# Patient Record
Sex: Male | Born: 1945 | Race: White | Hispanic: No | Marital: Married | Smoking: Never smoker
Health system: Southern US, Community
[De-identification: ages and names within clinical notes are randomized; demographics above are authoritative.]

## PROBLEM LIST (undated history)

## (undated) DIAGNOSIS — M5136 Other intervertebral disc degeneration, lumbar region: Secondary | ICD-10-CM

## (undated) DIAGNOSIS — E785 Hyperlipidemia, unspecified: Secondary | ICD-10-CM

## (undated) DIAGNOSIS — R739 Hyperglycemia, unspecified: Secondary | ICD-10-CM

## (undated) DIAGNOSIS — I10 Essential (primary) hypertension: Secondary | ICD-10-CM

## (undated) DIAGNOSIS — M51369 Other intervertebral disc degeneration, lumbar region without mention of lumbar back pain or lower extremity pain: Secondary | ICD-10-CM

## (undated) DIAGNOSIS — Z974 Presence of external hearing-aid: Secondary | ICD-10-CM

## (undated) HISTORY — PX: KNEE ARTHROSCOPY: SUR90

---
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
## 1993-09-14 HISTORY — PX: CARPAL TUNNEL RELEASE: SHX101

## 2000-09-14 HISTORY — PX: KNEE ARTHROSCOPY: SUR90

## 2012-07-25 ENCOUNTER — Ambulatory Visit: Payer: Self-pay | Admitting: Orthopedic Surgery

## 2012-08-01 ENCOUNTER — Ambulatory Visit: Payer: Self-pay | Admitting: Pain Medicine

## 2012-08-08 ENCOUNTER — Ambulatory Visit: Payer: Self-pay

## 2012-08-08 IMAGING — CT CT PELVIS W/O CM
1 of 2 series · 13 of 32 positions shown, 19 images · non-contrast
Comparison: none

REASON FOR EXAM: Dr RAYMONDE [REDACTED] [address]
RAYMONDE [HOSPITAL] [TI]...
COMMENTS:

PROCEDURE:     KCT - KCT PELVIS STANDARD WO  - [DATE]  [DATE]
RESULT:
TECHNIQUE: Multislice helical acquisition through the pelvis is
reconstructed at 3 mm slice thickness at bone window settings in the axial,
coronal and sagittal planes. There is no previous similar study for
comparison. Reconstructions through the left hip at bone window settings
were also performed and are dictated separately.

[Series 2: pelvisbone 3.0 b70f · axial · 0.93mm/px · z∈[-1318,-1084]mm · 13 of 92 slices shown, 19 images]
[im 7/92  soft-tissue]
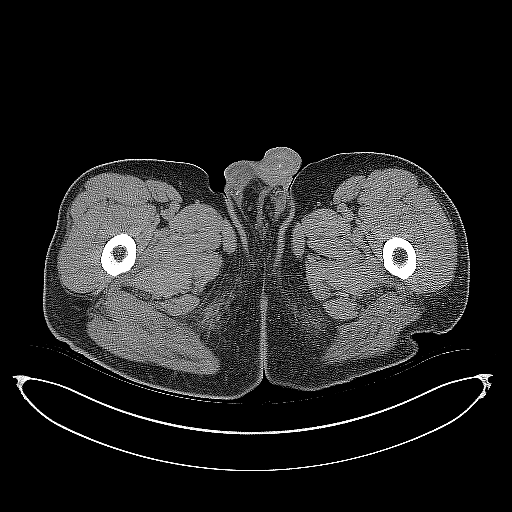
[im 7/92  bone]
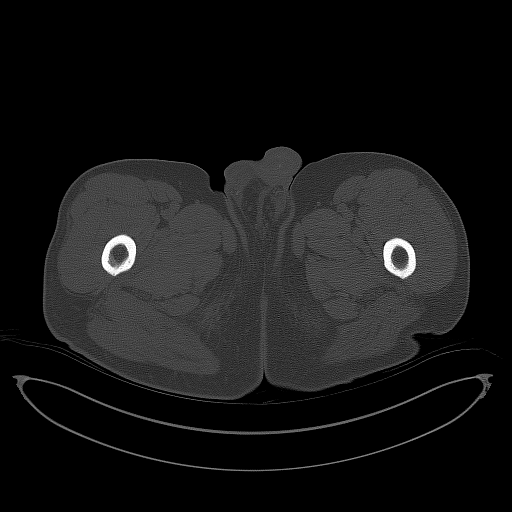
[im 13/92  soft-tissue]
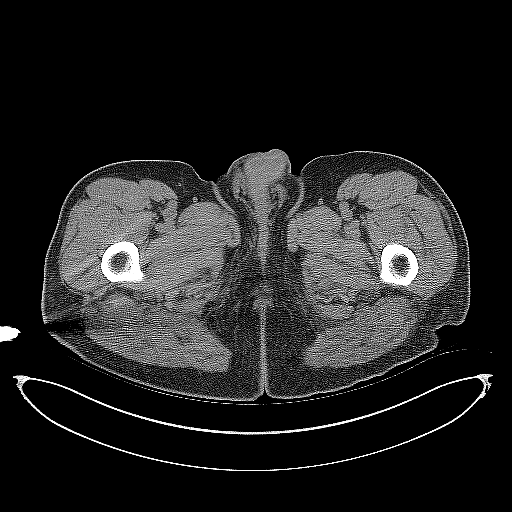
[im 19/92  soft-tissue]
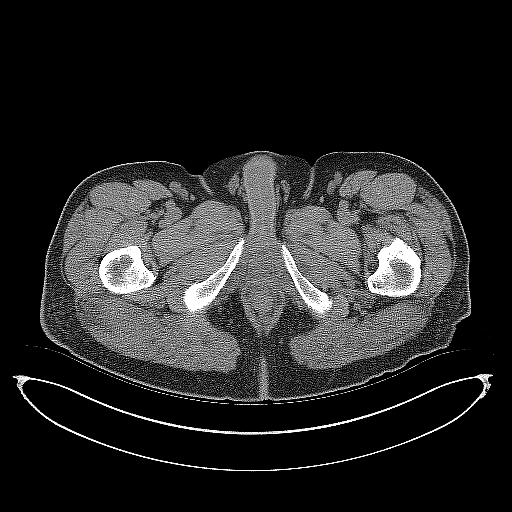
[im 25/92  soft-tissue]
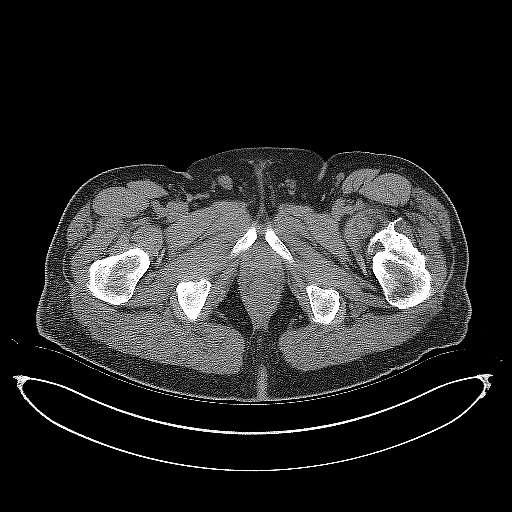
[im 31/92  soft-tissue]
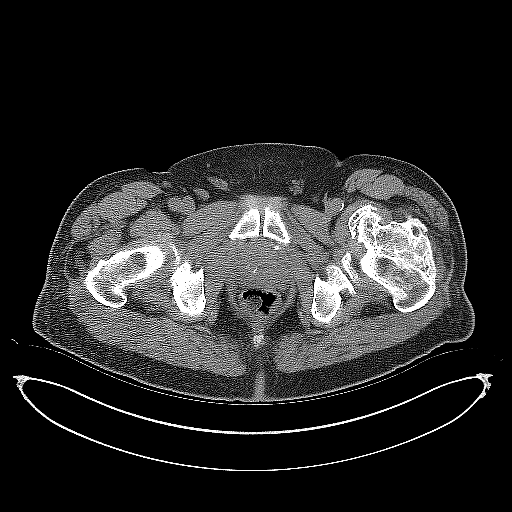
[im 37/92  soft-tissue]
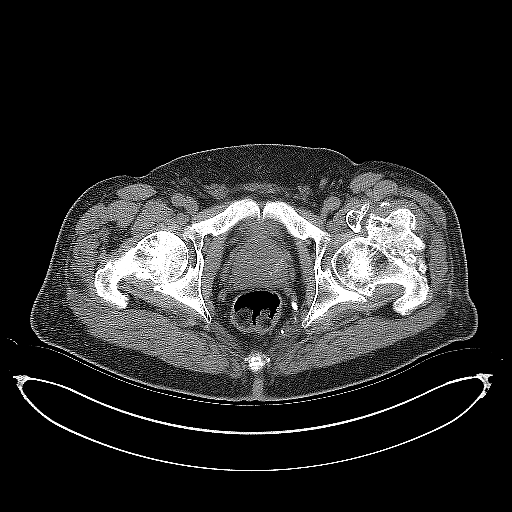
[im 49/92  soft-tissue]
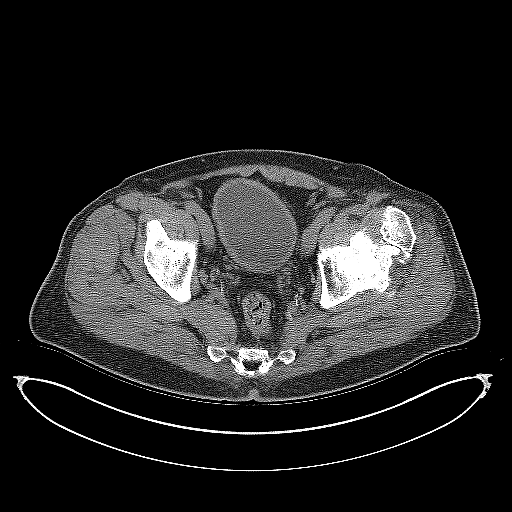
[im 55/92  soft-tissue]
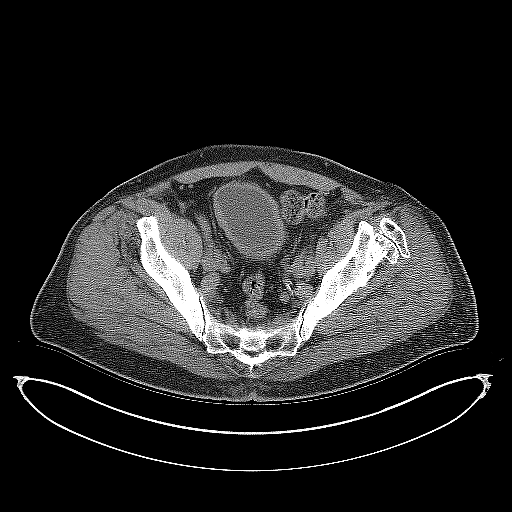
[im 61/92  soft-tissue]
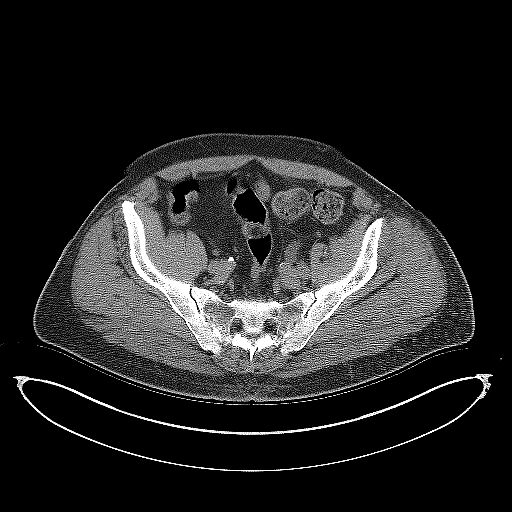
[im 61/92  bone]
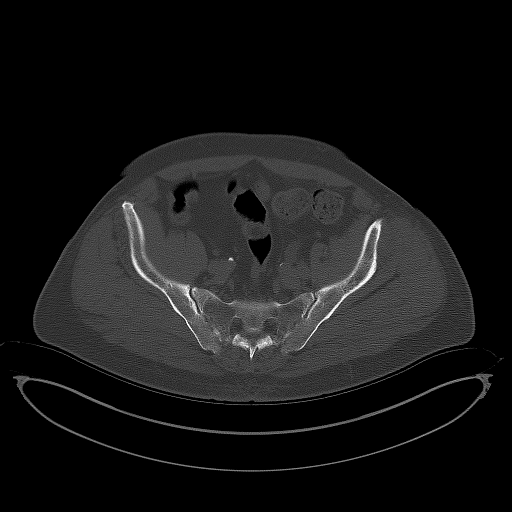
[im 67/92  soft-tissue]
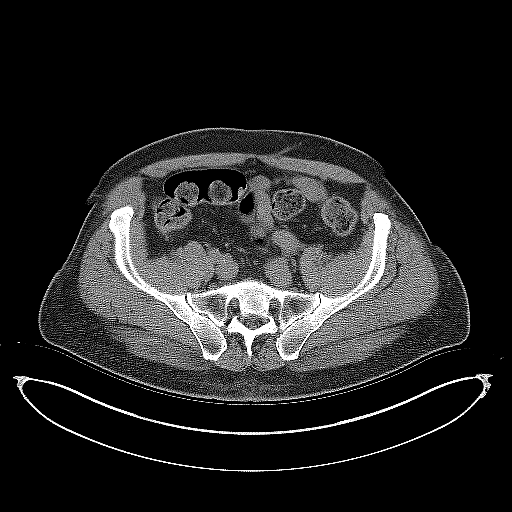
[im 67/92  lung]
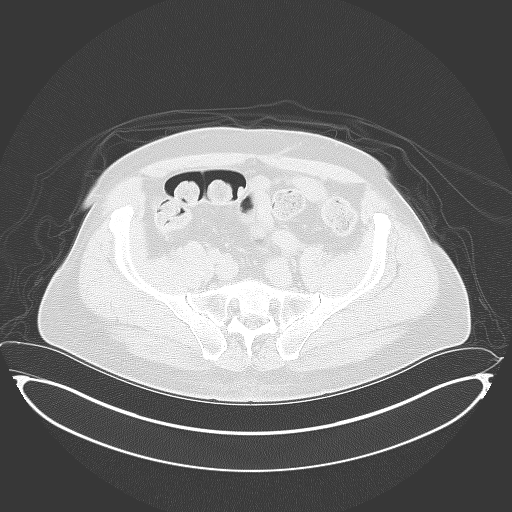
[im 73/92  soft-tissue]
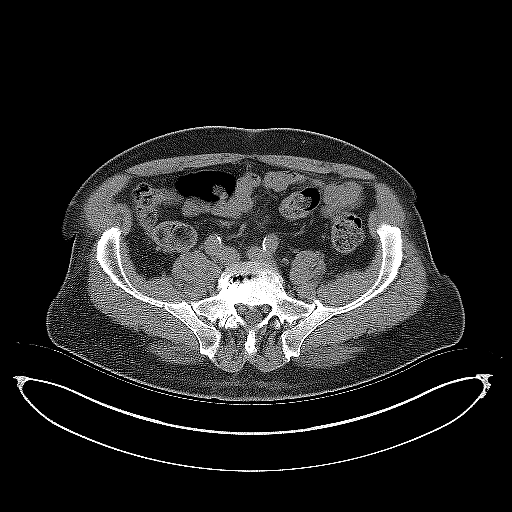
[im 73/92  lung]
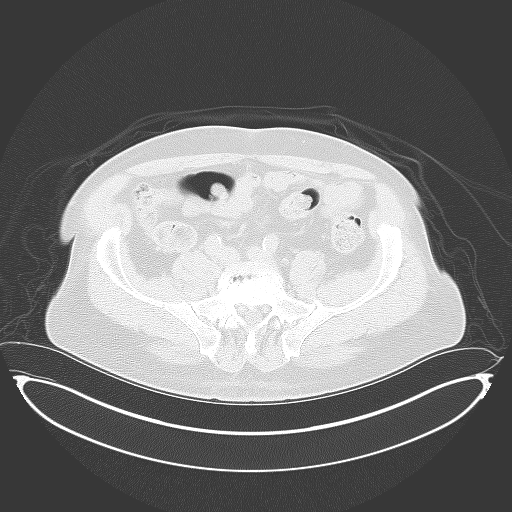
[im 79/92  soft-tissue]
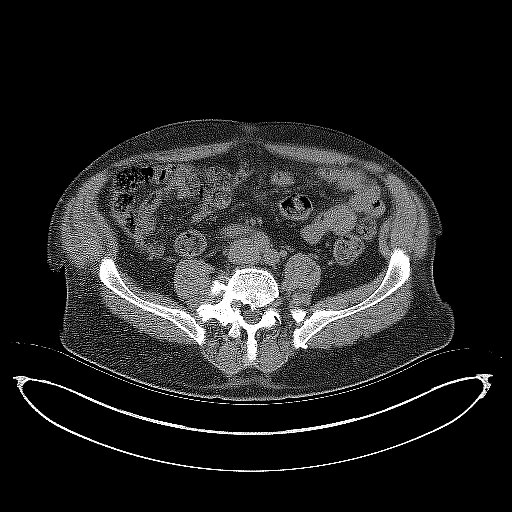
[im 79/92  lung]
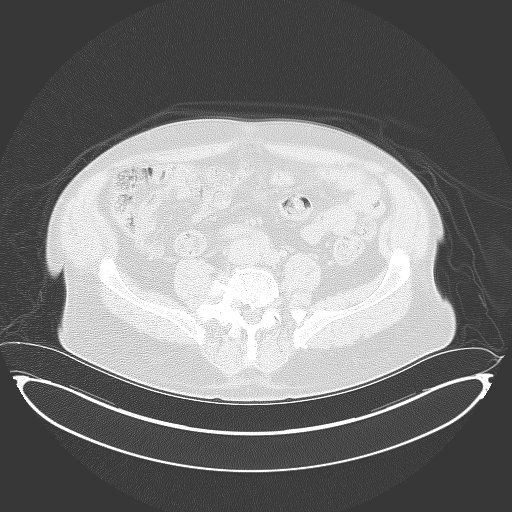
[im 85/92  soft-tissue]
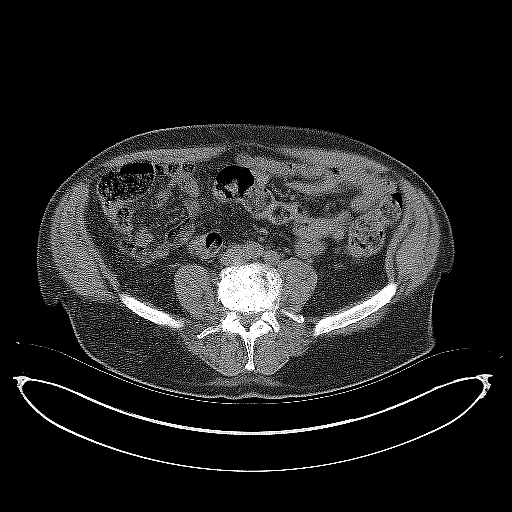
[im 85/92  lung]
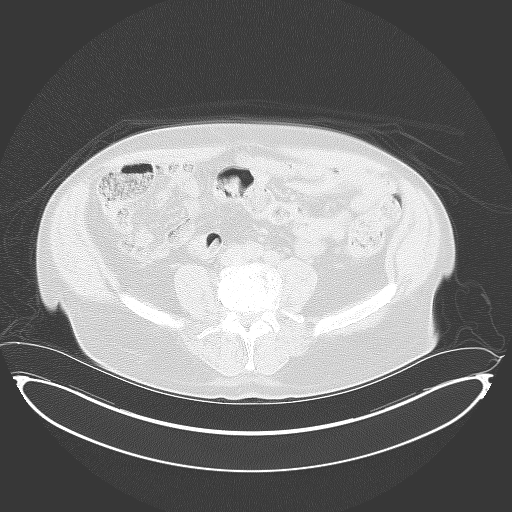

[13 of 32 positions shown; findings below may reference images not displayed]

FINDINGS: There is advanced heterotopic ossification predominantly anterior
to the proximal left femur and hip but also superior to the greater
trochanter and lateral to the acetabulum. Atherosclerotic calcification is
present. Multiple phleboliths are seen in the pelvic region. There is no
evidence of an acute fracture. Degenerative changes with hypertrophic
spurring are seen in the lumbosacral region and L4-5 level. L4-5
degenerative disc narrowing and L5-S1 degenerative disc narrowing with
prominent hypertrophic spurring are seen. Facet hypertrophy is present. The
bowel gas pattern appears unremarkable. The urinary bladder appears within
normal limits.
IMPRESSION: 1.  Advanced heterotopic ossification about the left hip.
2.  Degenerative changes in the lower lumbar spine.
3.  Atherosclerotic calcification.

[REDACTED]

## 2012-09-14 HISTORY — PX: JOINT REPLACEMENT: SHX530

## 2014-08-14 ENCOUNTER — Ambulatory Visit: Payer: Self-pay | Admitting: Otolaryngology

## 2014-09-11 ENCOUNTER — Encounter: Payer: Self-pay | Admitting: Otolaryngology

## 2014-09-14 ENCOUNTER — Encounter: Payer: Self-pay | Admitting: Otolaryngology

## 2015-05-29 ENCOUNTER — Other Ambulatory Visit: Payer: Self-pay | Admitting: Orthopedic Surgery

## 2015-05-29 DIAGNOSIS — M4726 Other spondylosis with radiculopathy, lumbar region: Secondary | ICD-10-CM

## 2015-05-29 DIAGNOSIS — M5126 Other intervertebral disc displacement, lumbar region: Secondary | ICD-10-CM

## 2015-05-29 DIAGNOSIS — M5136 Other intervertebral disc degeneration, lumbar region: Secondary | ICD-10-CM

## 2015-06-11 ENCOUNTER — Ambulatory Visit: Payer: Self-pay

## 2015-06-18 ENCOUNTER — Ambulatory Visit
Admission: RE | Admit: 2015-06-18 | Discharge: 2015-06-18 | Disposition: A | Discharge status: Home / Self Care | Payer: Medicare Other | Source: Ambulatory Visit | Attending: Orthopedic Surgery | Admitting: Orthopedic Surgery

## 2015-06-18 DIAGNOSIS — M4806 Spinal stenosis, lumbar region: Secondary | ICD-10-CM | POA: Diagnosis not present

## 2015-06-18 DIAGNOSIS — M419 Scoliosis, unspecified: Secondary | ICD-10-CM | POA: Diagnosis not present

## 2015-06-18 DIAGNOSIS — M5136 Other intervertebral disc degeneration, lumbar region: Secondary | ICD-10-CM | POA: Insufficient documentation

## 2015-06-18 DIAGNOSIS — M4726 Other spondylosis with radiculopathy, lumbar region: Secondary | ICD-10-CM | POA: Diagnosis present

## 2015-06-18 DIAGNOSIS — M5126 Other intervertebral disc displacement, lumbar region: Secondary | ICD-10-CM | POA: Diagnosis present

## 2016-07-01 ENCOUNTER — Encounter: Payer: Self-pay | Admitting: *Deleted

## 2016-07-02 ENCOUNTER — Encounter: Payer: Self-pay | Admitting: Anesthesiology

## 2016-07-02 ENCOUNTER — Ambulatory Visit: Payer: Medicare Other | Admitting: Anesthesiology

## 2016-07-02 ENCOUNTER — Encounter
Admission: RE | Disposition: A | Discharge status: Home / Self Care | Payer: Self-pay | Source: Ambulatory Visit | Attending: Gastroenterology

## 2016-07-02 ENCOUNTER — Ambulatory Visit
Admission: RE | Admit: 2016-07-02 | Discharge: 2016-07-02 | Disposition: A | Discharge status: Home / Self Care | Payer: Medicare Other | Source: Ambulatory Visit | Attending: Gastroenterology | Admitting: Gastroenterology

## 2016-07-02 DIAGNOSIS — Z7982 Long term (current) use of aspirin: Secondary | ICD-10-CM | POA: Diagnosis not present

## 2016-07-02 DIAGNOSIS — I10 Essential (primary) hypertension: Secondary | ICD-10-CM | POA: Diagnosis not present

## 2016-07-02 DIAGNOSIS — Z882 Allergy status to sulfonamides status: Secondary | ICD-10-CM | POA: Insufficient documentation

## 2016-07-02 DIAGNOSIS — K621 Rectal polyp: Secondary | ICD-10-CM | POA: Insufficient documentation

## 2016-07-02 DIAGNOSIS — E785 Hyperlipidemia, unspecified: Secondary | ICD-10-CM | POA: Insufficient documentation

## 2016-07-02 DIAGNOSIS — Z1211 Encounter for screening for malignant neoplasm of colon: Secondary | ICD-10-CM | POA: Diagnosis not present

## 2016-07-02 DIAGNOSIS — M5136 Other intervertebral disc degeneration, lumbar region: Secondary | ICD-10-CM | POA: Insufficient documentation

## 2016-07-02 DIAGNOSIS — Z880 Allergy status to penicillin: Secondary | ICD-10-CM | POA: Diagnosis not present

## 2016-07-02 DIAGNOSIS — Z791 Long term (current) use of non-steroidal anti-inflammatories (NSAID): Secondary | ICD-10-CM | POA: Diagnosis not present

## 2016-07-02 DIAGNOSIS — K635 Polyp of colon: Secondary | ICD-10-CM | POA: Insufficient documentation

## 2016-07-02 DIAGNOSIS — Z86010 Personal history of colonic polyps: Secondary | ICD-10-CM | POA: Diagnosis present

## 2016-07-02 DIAGNOSIS — Z79899 Other long term (current) drug therapy: Secondary | ICD-10-CM | POA: Diagnosis not present

## 2016-07-02 DIAGNOSIS — Z883 Allergy status to other anti-infective agents status: Secondary | ICD-10-CM | POA: Insufficient documentation

## 2016-07-02 HISTORY — PX: COLONOSCOPY WITH PROPOFOL: SHX5780

## 2016-07-02 HISTORY — DX: Other intervertebral disc degeneration, lumbar region: M51.36

## 2016-07-02 HISTORY — DX: Hyperglycemia, unspecified: R73.9

## 2016-07-02 HISTORY — DX: Essential (primary) hypertension: I10

## 2016-07-02 HISTORY — DX: Hyperlipidemia, unspecified: E78.5

## 2016-07-02 HISTORY — DX: Other intervertebral disc degeneration, lumbar region without mention of lumbar back pain or lower extremity pain: M51.369

## 2016-07-02 SURGERY — COLONOSCOPY WITH PROPOFOL
Anesthesia: General

## 2016-07-02 MED ORDER — PROPOFOL 10 MG/ML IV BOLUS
INTRAVENOUS | Status: DC | PRN
  Administered 2016-07-02: 30 mg via INTRAVENOUS
  Administered 2016-07-02: 20 mg via INTRAVENOUS
  Administered 2016-07-02: 50 mg via INTRAVENOUS

## 2016-07-02 MED ORDER — SODIUM CHLORIDE 0.9 % IV SOLN: INTRAVENOUS | Status: DC

## 2016-07-02 MED ORDER — SODIUM CHLORIDE 0.9 % IV SOLN
INTRAVENOUS | Status: DC
  Administered 2016-07-02: 08:00:00 via INTRAVENOUS

## 2016-07-02 MED ORDER — MIDAZOLAM HCL 5 MG/5ML IJ SOLN
INTRAMUSCULAR | Status: DC | PRN
  Administered 2016-07-02: 1 mg via INTRAVENOUS

## 2016-07-02 MED ORDER — PROPOFOL 500 MG/50ML IV EMUL
INTRAVENOUS | Status: DC | PRN
  Administered 2016-07-02: 120 ug/kg/min via INTRAVENOUS

## 2016-07-02 MED ORDER — FENTANYL CITRATE (PF) 100 MCG/2ML IJ SOLN
INTRAMUSCULAR | Status: DC | PRN
  Administered 2016-07-02: 50 ug via INTRAVENOUS

## 2016-07-02 MED ORDER — LIDOCAINE 2% (20 MG/ML) 5 ML SYRINGE
INTRAMUSCULAR | Status: DC | PRN
  Administered 2016-07-02: 50 mg via INTRAVENOUS

## 2016-07-02 NOTE — H&P (Signed)
Outpatient short stay form Pre-procedure 07/02/2016 8:22 AM Russell DeemMartin Wu Russell [name] MD  Primary Physician: Dr. Aram BeechamJeffrey Wu  Reason for visit:  Colonoscopy  History of present illness:  Patient is a 70 year old male presenting today for colonoscopy. He has personal history of colon polyps. He tolerated his prep well. He does not take any regular aspirin or blood thinning agents. He did have a total hip arthroplasty in January 2014, over 3 years ago.    Current Facility-Administered Medications:  .  0.9 %  sodium chloride infusion, , Intravenous, Continuous, Russell DeemMartin Wu Russell Weinfeld, MD  Prescriptions Prior to Admission  Medication Sig Dispense Refill Last Dose  . acetaminophen (TYLENOL) 500 MG tablet Take 500 mg by mouth every 4 (four) hours as needed.   07/02/2016 at 0600  . amLODipine (NORVASC) 10 MG tablet Take 10 mg by mouth daily.   Past Week at 2100  . aspirin 325 MG tablet Take 325 mg by mouth daily.   Past Week at Unknown time  . atorvastatin (LIPITOR) 20 MG tablet Take 20 mg by mouth daily.   Past Week at Unknown time  . doxazosin (CARDURA) 2 MG tablet Take 2 mg by mouth daily.   Past Week at Unknown time  . hydrochlorothiazide (HYDRODIURIL) 25 MG tablet Take 25 mg by mouth daily.   Past Week at Unknown time  . losartan (COZAAR) 100 MG tablet Take 100 mg by mouth daily.   Past Week at Unknown time  . magnesium oxide (MAG-OX) 400 MG tablet Take 400 mg by mouth daily.   Past Week at Unknown time  . naproxen sodium (ANAPROX) 220 MG tablet Take 220 mg by mouth 2 (two) times daily with a meal.   Past Week at Unknown time  . potassium chloride SA (K-DUR,KLOR-CON) 20 MEQ tablet Take 20 mEq by mouth 2 (two) times daily.   Past Week at Unknown time  . clindamycin (CLEOCIN) 150 MG capsule Take by mouth daily.   Completed Course at Unknown time     Allergies  Allergen Reactions  . Lincocin [Lincomycin Hcl]   . Penicillins   . Sulfa Antibiotics      Past Medical History:  Diagnosis Date  .  Degenerative disc disease, lumbar   . Hyperglycemia   . Hyperlipidemia   . Hypertension     Review of systems:      Physical Exam    Heart and lungs: Regular rate and rhythm without rub or gallop, lungs are bilaterally clear.    HEENT: Normocephalic atraumatic eyes are anicteric    Other:     Pertinant exam for procedure: Soft nontender nondistended bowel sounds positive normoactive.    Planned proceedures: Colonoscopy and indicated procedures. I have discussed the risks benefits and complications of procedures to include not limited to bleeding, infection, perforation and the risk of sedation and the patient wishes to proceed.    Russell DeemMartin Wu Russell Nazir, MD Gastroenterology 07/02/2016  8:22 AM

## 2016-07-02 NOTE — Anesthesia Preprocedure Evaluation (Signed)
Anesthesia Evaluation  Patient identified by MRN, date of birth, ID band Patient awake    Reviewed: Allergy & Precautions, H&P , NPO status , Patient's Chart, lab work & pertinent test results  Airway Mallampati: I  TM Distance: >3 FB Neck ROM: limited    Dental  (+) Poor Dentition, Chipped   Pulmonary COPD,    Pulmonary exam normal breath sounds clear to auscultation       Cardiovascular Exercise Tolerance: Good hypertension, (-) angina(-) Past MI and (-) DOE Normal cardiovascular exam Rhythm:regular Rate:Normal     Neuro/Psych negative neurological ROS  negative psych ROS   GI/Hepatic negative GI ROS, Neg liver ROS, neg GERD  ,  Endo/Other  negative endocrine ROS  Renal/GU negative Renal ROS  negative genitourinary   Musculoskeletal  (+) Arthritis ,   Abdominal   Peds  Hematology negative hematology ROS (+)   Anesthesia Other Findings Past Medical History: No date: Degenerative disc disease, lumbar No date: Hyperglycemia No date: Hyperlipidemia No date: Hypertension  History reviewed. No pertinent surgical history.     Reproductive/Obstetrics negative OB ROS                             Anesthesia Physical Anesthesia Plan  ASA: III  Anesthesia Plan: General   Post-op Pain Management:    Induction:   Airway Management Planned:   Additional Equipment:   Intra-op Plan:   Post-operative Plan:   Informed Consent: I have reviewed the patients History and Physical, chart, labs and discussed the procedure including the risks, benefits and alternatives for the proposed anesthesia with the patient or authorized representative who has indicated his/her understanding and acceptance.   Dental Advisory Given  Plan Discussed with: Anesthesiologist, CRNA and Surgeon  Anesthesia Plan Comments:         Anesthesia Quick Evaluation

## 2016-07-02 NOTE — Anesthesia Postprocedure Evaluation (Signed)
Anesthesia Post Note  Patient: Russell Wu  Procedure(s) Performed: Procedure(s) (LRB): COLONOSCOPY WITH PROPOFOL (N/A)  Patient location during evaluation: Endoscopy Anesthesia Type: General Level of consciousness: awake and alert Pain management: pain level controlled Vital Signs Assessment: post-procedure vital signs reviewed and stable Respiratory status: spontaneous breathing, nonlabored ventilation, respiratory function stable and patient connected to nasal cannula oxygen Cardiovascular status: blood pressure returned to baseline and stable Postop Assessment: no signs of nausea or vomiting Anesthetic complications: no    Last Vitals:  Vitals:   07/02/16 1040 07/02/16 1050  BP: 131/82 115/85  Pulse: 84 83  Resp: 18 (!) 21  Temp:      Last Pain:  Vitals:   07/02/16 1020  TempSrc: Tympanic  PainSc: Asleep                 Cleda MccreedyJoseph K Jatziri Goffredo

## 2016-07-02 NOTE — Transfer of Care (Signed)
Immediate Anesthesia Transfer of Care Note  Patient: Russell Wu  Procedure(s) Performed: Procedure(s): COLONOSCOPY WITH PROPOFOL (N/A)  Patient Location: Endoscopy Unit  Anesthesia Type:General  Level of Consciousness: sedated  Airway & Oxygen Therapy: Patient Spontanous Breathing and Patient connected to nasal cannula oxygen  Post-op Assessment: Report given to RN and Post -op Vital signs reviewed and stable  Post vital signs: Reviewed  Last Vitals:  Vitals:   07/02/16 0809 07/02/16 1020  BP: (!) 148/86 107/70  Pulse: 83 70  Resp: 16 18  Temp: (!) 35.8 C 36.3 C    Last Pain:  Vitals:   07/02/16 0809  TempSrc: Tympanic         Complications: No apparent anesthesia complications

## 2016-07-02 NOTE — Op Note (Addendum)
[facility] Gastroenterology Patient Name: Russell SchwartzLloyd Morella Procedure Date: 07/02/2016 9:37 AM MRN: [id number] Account #: [id number] Date of Birth: [birth year] Admit Type: Outpatient Age: [age] [facility] END[facility] ROOM 3 Gender: Male Note Status: Finalized Procedure:            Colonoscopy Indications:          Personal history of colonic polyps Providers:            Christena DeemMartin U. Rande Dario, MD Referring MD:         Duane LopeJeffrey D. Judithann SheenSparks, MD (Referring MD) Medicines:            Monitored Anesthesia Care Complications:        No immediate complications. Procedure:            Pre-Anesthesia Assessment:                       - ASA Grade Assessment: III - A patient with severe                        systemic disease.                       After obtaining informed consent, the colonoscope was                        passed under direct vision. Throughout the procedure,                        the patient's blood pressure, pulse, and oxygen                        saturations were monitored continuously. The                        Colonoscope was introduced through the anus and                        advanced to the the cecum, identified by appendiceal                        orifice and ileocecal valve. The colonoscopy was                        performed with moderate difficulty due to significant                        looping and a tortuous colon. Successful completion of                        the procedure was aided by using manual pressure. The                        patient tolerated the procedure well. The quality of                        the bowel preparation was good. Findings:      A 1 mm polyp was found in the hepatic flexure. The polyp was sessile.       The polyp was removed with a cold biopsy forceps. Resection and       retrieval were  complete.      Two sessile polyps were found in the rectum. The polyps were less than 1       mm in size. These polyps were removed with a  cold biopsy forceps.       Resection and retrieval were complete.      The retroflexed view of the distal rectum and anal verge was normal and       showed no anal or rectal abnormalities.      The exam was otherwise normal throughout the examined colon.      The digital rectal exam was normal. Impression:           - One 1 mm polyp at the hepatic flexure, removed with a                        cold biopsy forceps. Resected and retrieved.                       - Two less than 1 mm polyps in the rectum, removed with                        a cold biopsy forceps. Resected and retrieved.                       - The distal rectum and anal verge are normal on                        retroflexion view. Recommendation:       - Discharge patient to home.                       - Await pathology results.                       - Telephone GI clinic for pathology results in 1 week. Procedure Code(s):    --- Professional ---                       [phone], Colonoscopy, flexible; with biopsy, single or                        multiple Diagnosis Code(s):    --- Professional ---                       D12.3, Benign neoplasm of transverse colon (hepatic                        flexure or splenic flexure)                       K62.1, Rectal polyp                       Z86.010, Personal history of colonic polyps CPT copyright 2016 American Medical Association. All rights reserved. The codes documented in this report are preliminary and upon coder review may  be revised to meet current compliance requirements. Christena Deem, MD 07/02/2016 10:18:50 AM This report has been signed electronically. Number of Addenda: 0 Note Initiated On: 07/02/2016 9:37 AM Scope Withdrawal Time: 0 hours 5 minutes 16 seconds  Total Procedure Duration: 0 hours 26 minutes 52 seconds  [facility]

## 2016-07-03 LAB — SURGICAL PATHOLOGY

## 2016-07-04 ENCOUNTER — Encounter: Payer: Self-pay | Admitting: Gastroenterology

## 2019-02-24 ENCOUNTER — Other Ambulatory Visit: Payer: Self-pay

## 2019-02-24 ENCOUNTER — Other Ambulatory Visit
Admission: RE | Admit: 2019-02-24 | Discharge: 2019-02-24 | Disposition: A | Discharge status: Home / Self Care | Payer: Medicare Other | Source: Ambulatory Visit | Attending: Internal Medicine | Admitting: Internal Medicine

## 2019-02-24 DIAGNOSIS — Z1159 Encounter for screening for other viral diseases: Secondary | ICD-10-CM | POA: Diagnosis not present

## 2019-02-24 DIAGNOSIS — Z01812 Encounter for preprocedural laboratory examination: Secondary | ICD-10-CM | POA: Insufficient documentation

## 2019-02-25 LAB — NOVEL CORONAVIRUS, NAA (HOSP ORDER, SEND-OUT TO REF LAB; TAT 18-24 HRS): SARS-CoV-2, NAA: NOT DETECTED

## 2019-03-01 ENCOUNTER — Other Ambulatory Visit: Payer: Self-pay

## 2019-03-01 ENCOUNTER — Ambulatory Visit
Admission: RE | Admit: 2019-03-01 | Discharge: 2019-03-01 | Disposition: A | Discharge status: Home / Self Care | Payer: Medicare Other | Attending: Internal Medicine | Admitting: Internal Medicine

## 2019-03-01 ENCOUNTER — Encounter
Admission: RE | Disposition: A | Discharge status: Home / Self Care | Payer: Self-pay | Source: Home / Self Care | Attending: Internal Medicine

## 2019-03-01 ENCOUNTER — Ambulatory Visit: Payer: Medicare Other | Admitting: Anesthesiology

## 2019-03-01 ENCOUNTER — Encounter: Payer: Self-pay | Admitting: *Deleted

## 2019-03-01 DIAGNOSIS — D509 Iron deficiency anemia, unspecified: Secondary | ICD-10-CM | POA: Insufficient documentation

## 2019-03-01 DIAGNOSIS — E785 Hyperlipidemia, unspecified: Secondary | ICD-10-CM | POA: Diagnosis not present

## 2019-03-01 DIAGNOSIS — Z882 Allergy status to sulfonamides status: Secondary | ICD-10-CM | POA: Diagnosis not present

## 2019-03-01 DIAGNOSIS — Z791 Long term (current) use of non-steroidal anti-inflammatories (NSAID): Secondary | ICD-10-CM | POA: Insufficient documentation

## 2019-03-01 DIAGNOSIS — Z7982 Long term (current) use of aspirin: Secondary | ICD-10-CM | POA: Diagnosis not present

## 2019-03-01 DIAGNOSIS — Z880 Allergy status to penicillin: Secondary | ICD-10-CM | POA: Insufficient documentation

## 2019-03-01 DIAGNOSIS — Z79899 Other long term (current) drug therapy: Secondary | ICD-10-CM | POA: Diagnosis not present

## 2019-03-01 DIAGNOSIS — K640 First degree hemorrhoids: Secondary | ICD-10-CM | POA: Diagnosis not present

## 2019-03-01 DIAGNOSIS — I10 Essential (primary) hypertension: Secondary | ICD-10-CM | POA: Diagnosis not present

## 2019-03-01 DIAGNOSIS — R739 Hyperglycemia, unspecified: Secondary | ICD-10-CM | POA: Diagnosis not present

## 2019-03-01 HISTORY — PX: COLONOSCOPY WITH PROPOFOL: SHX5780

## 2019-03-01 HISTORY — PX: ESOPHAGOGASTRODUODENOSCOPY (EGD) WITH PROPOFOL: SHX5813

## 2019-03-01 SURGERY — ESOPHAGOGASTRODUODENOSCOPY (EGD) WITH PROPOFOL
Anesthesia: General

## 2019-03-01 MED ORDER — LIDOCAINE HCL (PF) 2 % IJ SOLN
INTRAMUSCULAR | Status: AC
  Filled 2019-03-01: qty 10

## 2019-03-01 MED ORDER — PROPOFOL 10 MG/ML IV BOLUS
INTRAVENOUS | Status: AC
  Filled 2019-03-01: qty 20

## 2019-03-01 MED ORDER — PROPOFOL 10 MG/ML IV BOLUS
INTRAVENOUS | Status: DC | PRN
  Administered 2019-03-01: 80 mg via INTRAVENOUS

## 2019-03-01 MED ORDER — PROPOFOL 500 MG/50ML IV EMUL
INTRAVENOUS | Status: DC | PRN
  Administered 2019-03-01: 160 ug/kg/min via INTRAVENOUS

## 2019-03-01 MED ORDER — PROPOFOL 500 MG/50ML IV EMUL
INTRAVENOUS | Status: AC
  Filled 2019-03-01: qty 50

## 2019-03-01 MED ORDER — SODIUM CHLORIDE 0.9 % IV SOLN
INTRAVENOUS | Status: DC
  Administered 2019-03-01: 09:00:00 via INTRAVENOUS

## 2019-03-01 NOTE — H&P (Signed)
Outpatient short stay form Pre-procedure 03/01/2019 8:16 AM Russell Wu K. Russell Wu, M.D.  Primary Physician: Fulton Reek, M.D.  Reason for visit: Iron deficiency anemia.   History of present illness:  Patient presents with iron deficiency anemia. He uses NSAIDs. Patient denies change in bowel habits, rectal bleeding, weight loss or abdominal pain.  EGD and colonoscopy are planned for today. Last colonoscopy in 2017 showed only hyperplastic polyps and no other abnormalities.     Current Facility-Administered Medications:  .  0.9 %  sodium chloride infusion, , Intravenous, Continuous, Jameson Tormey, Benay Pike, MD  Medications Prior to Admission  Medication Sig Dispense Refill Last Dose  . acetaminophen (TYLENOL) 500 MG tablet Take 500 mg by mouth every 4 (four) hours as needed.   03/01/2019 at Unknown time  . amLODipine (NORVASC) 10 MG tablet Take 10 mg by mouth daily.   02/28/2019 at Unknown time  . doxazosin (CARDURA) 2 MG tablet Take 2 mg by mouth daily.   02/28/2019 at Unknown time  . ezetimibe (ZETIA) 10 MG tablet Take 10 mg by mouth daily.     . hydrochlorothiazide (HYDRODIURIL) 25 MG tablet Take 25 mg by mouth daily.   02/28/2019 at Unknown time  . losartan (COZAAR) 100 MG tablet Take 100 mg by mouth daily.   02/28/2019 at Unknown time  . naproxen sodium (ANAPROX) 220 MG tablet Take 220 mg by mouth 2 (two) times daily with a meal.   Past Week at Unknown time  . potassium chloride SA (K-DUR,KLOR-CON) 20 MEQ tablet Take 20 mEq by mouth 2 (two) times daily.   02/28/2019 at Unknown time  . aspirin 325 MG tablet Take 325 mg by mouth daily.   Not Taking at Unknown time  . atorvastatin (LIPITOR) 20 MG tablet Take 20 mg by mouth daily.   Not Taking at Unknown time  . clindamycin (CLEOCIN) 150 MG capsule Take by mouth daily.     . magnesium oxide (MAG-OX) 400 MG tablet Take 400 mg by mouth daily.        Allergies  Allergen Reactions  . Lincocin [Lincomycin Hcl]   . Penicillins   . Sulfa Antibiotics       Past Medical History:  Diagnosis Date  . Degenerative disc disease, lumbar   . Hyperglycemia   . Hyperlipidemia   . Hypertension     Review of systems:  Otherwise negative.    Physical Exam  Gen: Alert, oriented. Appears stated age.  HEENT: [place]/AT. PERRLA. Lungs: CTA, no wheezes. CV: RR nl S1, S2. Abd: soft, benign, no masses. BS+ Ext: No edema. Pulses 2+    Planned procedures: Proceed with EGD and colonoscopy. The patient understands the nature of the planned procedure, indications, risks, alternatives and potential complications including but not limited to bleeding, infection, perforation, damage to internal organs and possible oversedation/side effects from anesthesia. The patient agrees and gives consent to proceed.  Please refer to procedure notes for findings, recommendations and patient disposition/instructions.     Russell Wu K. Russell Wu, M.D. Gastroenterology 03/01/2019  8:16 AM

## 2019-03-01 NOTE — Anesthesia Procedure Notes (Signed)
Procedure Name: MAC Date/Time: 03/01/2019 8:24 AM Performed by: Gentry Fitz, CRNA Pre-anesthesia Checklist: Patient identified, Emergency Drugs available, Suction available and Patient being monitored Patient Re-evaluated:Patient Re-evaluated prior to induction Oxygen Delivery Method: Nasal cannula Preoxygenation: Pre-oxygenation with 100% oxygen Induction Type: IV induction

## 2019-03-01 NOTE — Anesthesia Preprocedure Evaluation (Signed)
Anesthesia Evaluation  Patient identified by MRN, date of birth, ID band Patient awake    Reviewed: Allergy & Precautions, H&P , NPO status , Patient's Chart, lab work & pertinent test results, reviewed documented beta blocker date and time   Airway Mallampati: II   Neck ROM: full    Dental  (+) Poor Dentition   Pulmonary neg pulmonary ROS,    Pulmonary exam normal        Cardiovascular hypertension, negative cardio ROS Normal cardiovascular exam Rhythm:regular Rate:Normal     Neuro/Psych negative neurological ROS  negative psych ROS   GI/Hepatic negative GI ROS, Neg liver ROS,   Endo/Other  negative endocrine ROS  Renal/GU negative Renal ROS  negative genitourinary   Musculoskeletal   Abdominal   Peds  Hematology negative hematology ROS (+)   Anesthesia Other Findings Past Medical History: No date: Degenerative disc disease, lumbar No date: Hyperglycemia No date: Hyperlipidemia No date: Hypertension Past Surgical History: 1995: CARPAL TUNNEL RELEASE; Bilateral 1950: COLON SURGERY     Comment:  rectal polyp removed when 73 years old 07/02/2016: COLONOSCOPY WITH PROPOFOL; N/A     Comment:  Procedure: COLONOSCOPY WITH PROPOFOL;  Surgeon: Lollie Sails, MD;  Location: [facility] ENDOSCOPY;  Service:               Endoscopy;  Laterality: N/A; 2014: JOINT REPLACEMENT; Left early 2000s: KNEE ARTHROSCOPY; Left 2002: KNEE ARTHROSCOPY; Right 1957: TONSILLECTOMY BMI    Body Mass Index: 28.51 kg/m     Reproductive/Obstetrics negative OB ROS                             Anesthesia Physical Anesthesia Plan  ASA: III  Anesthesia Plan: General   Post-op Pain Management:    Induction:   PONV Risk Score and Plan:   Airway Management Planned:   Additional Equipment:   Intra-op Plan:   Post-operative Plan:   Informed Consent: I have reviewed the patients History and  Physical, chart, labs and discussed the procedure including the risks, benefits and alternatives for the proposed anesthesia with the patient or authorized representative who has indicated his/her understanding and acceptance.     Dental Advisory Given  Plan Discussed with: CRNA  Anesthesia Plan Comments:         Anesthesia Quick Evaluation

## 2019-03-01 NOTE — Anesthesia Post-op Follow-up Note (Signed)
Anesthesia QCDR form completed.        

## 2019-03-01 NOTE — Op Note (Signed)
[facility] Gastroenterology Patient Name: Russell Wu Procedure Date: 03/01/2019 7:01 AM MRN: [id number] Account #: [id number] Date of Birth: 1945/11/17 Admit Type: Outpatient Age: 73 Room: [facility] ENDO ROOM 3 Gender: Male Note Status: Finalized Procedure:            Upper GI endoscopy Indications:          Iron deficiency anemia Providers:            Benay Pike. [place] MD, MD Medicines:            Propofol per Anesthesia Complications:        No immediate complications. Procedure:            Pre-Anesthesia Assessment:                       - The risks and benefits of the procedure and the                        sedation options and risks were discussed with the                        patient. All questions were answered and informed                        consent was obtained.                       - Patient identification and proposed procedure were                        verified prior to the procedure by the nurse. The                        procedure was verified in the procedure room.                       - ASA Grade Assessment: III - A patient with severe                        systemic disease.                       - After reviewing the risks and benefits, the patient                        was deemed in satisfactory condition to undergo the                        procedure.                       After obtaining informed consent, the endoscope was                        passed under direct vision. Throughout the procedure,                        the patient's blood pressure, pulse, and oxygen                        saturations were monitored continuously. The Endoscope  was introduced through the mouth, and advanced to the                        third part of duodenum. The upper GI endoscopy was                        accomplished without difficulty. The patient tolerated                        the procedure well. Findings:  The examined esophagus was normal.      The entire examined stomach was normal.      The examined duodenum was normal.      The examined duodenum was normal. Impression:           - Normal esophagus.                       - Normal stomach.                       - Normal examined duodenum.                       - Normal examined duodenum.                       - No specimens collected. Recommendation:       - Proceed with colonoscopy Procedure Code(s):    --- Professional ---                       [id number], Esophagogastroduodenoscopy, flexible, transoral;                        diagnostic, including collection of specimen(s) by                        brushing or washing, when performed (separate procedure) Diagnosis Code(s):    --- Professional ---                       D50.9, Iron deficiency anemia, unspecified CPT copyright 2019 American Medical Association. All rights reserved. The codes documented in this report are preliminary and upon coder review may  be revised to meet current compliance requirements. Stanton Kidneyeodoro K Toledo MD, MD 03/01/2019 8:28:53 AM This report has been signed electronically. Number of Addenda: 0 Note Initiated On: 03/01/2019 7:01 AM Estimated Blood Loss: Estimated blood loss: none.      [facility]

## 2019-03-01 NOTE — Interval H&P Note (Signed)
History and Physical Interval Note:  03/01/2019 8:18 AM  Russell Wu  has presented today for surgery, with the diagnosis of Iron Deficiency Anemia.  The various methods of treatment have been discussed with the patient and family. After consideration of risks, benefits and other options for treatment, the patient has consented to  Procedure(s): ESOPHAGOGASTRODUODENOSCOPY (EGD) WITH PROPOFOL (N/A) COLONOSCOPY WITH PROPOFOL (N/A) as a surgical intervention.  The patient's history has been reviewed, patient examined, no change in status, stable for surgery.  I have reviewed the patient's chart and labs.  Questions were answered to the patient's satisfaction.     [place], [place]

## 2019-03-01 NOTE — Op Note (Signed)
[facility] Patient Name: Russell Wu Procedure Date: 03/01/2019 6:59 AM MRN: [id number] Account #: [id number] Date of Birth: [birth year] Admit Type: Outpatient Age: 73 Room: [facility] ENDO ROOM 3 Gender: Male Note Status: Finalized Procedure:            Colonoscopy Indications:          Iron deficiency anemia Providers:            Benay Pike. Toledo MD, MD Medicines:            Propofol per Anesthesia Complications:        No immediate complications. Procedure:            Pre-Anesthesia Assessment:                       - The risks and benefits of the procedure and the                        sedation options and risks were discussed with the                        patient. All questions were answered and informed                        consent was obtained.                       - Patient identification and proposed procedure were                        verified prior to the procedure by the nurse. The                        procedure was verified in the procedure room.                       - ASA Grade Assessment: III - A patient with severe                        systemic disease.                       - After reviewing the risks and benefits, the patient                        was deemed in satisfactory condition to undergo the                        procedure.                       After obtaining informed consent, the colonoscope was                        passed under direct vision. Throughout the procedure,                        the patient's blood pressure, pulse, and oxygen                        saturations were monitored continuously. The  Colonoscope was introduced through the anus and                        advanced to the the cecum, identified by appendiceal                        orifice and ileocecal valve. The colonoscopy was                        somewhat difficult due to a redundant colon. Successful                  completion of the procedure was aided by applying                        abdominal pressure. The patient tolerated the procedure                        well. The quality of the bowel preparation was                        excellent. Findings:      The perianal and digital rectal examinations were normal. Pertinent       negatives include normal sphincter tone and no palpable rectal lesions.      The colon (entire examined portion) appeared normal.      Non-bleeding internal hemorrhoids were found during retroflexion. The       hemorrhoids were Grade I (internal hemorrhoids that do not prolapse).      The exam was otherwise without abnormality. Impression:           - The entire examined colon is normal.                       - Non-bleeding internal hemorrhoids.                       - The examination was otherwise normal.                       - No specimens collected. Recommendation:       - Patient has a contact number available for                        emergencies. The signs and symptoms of potential                        delayed complications were discussed with the patient.                        Return to normal activities tomorrow. Written discharge                        instructions were provided to the patient.                       - Resume previous diet.                       - Continue present medications.                       - To  visualize the small bowel, perform video capsule                        endoscopy at appointment to be scheduled.                       - No further colonoscopies due to age and no evidence                        of adenomas/polyps.                       - The findings and recommendations were discussed with                        the patient. Procedure Code(s):    --- Professional ---                       [phone], Colonoscopy, flexible; diagnostic, including                        collection of specimen(s) by brushing or  washing, when                        performed (separate procedure) Diagnosis Code(s):    --- Professional ---                       D50.9, Iron deficiency anemia, unspecified                       K64.0, First degree hemorrhoids CPT copyright 2019 American Medical Association. All rights reserved. The codes documented in this report are preliminary and upon coder review may  be revised to meet current compliance requirements. Stanton Kidneyeodoro K Toledo MD, MD 03/01/2019 8:48:20 AM This report has been signed electronically. Number of Addenda: 0 Note Initiated On: 03/01/2019 6:59 AM Scope Withdrawal Time: 0 hours 6 minutes 9 seconds  Total Procedure Duration: 0 hours 12 minutes 51 seconds  Estimated Blood Loss: Estimated blood loss: none.      [facility]

## 2019-03-01 NOTE — Transfer of Care (Signed)
Immediate Anesthesia Transfer of Care Note  Patient: Russell Wu  Procedure(s) Performed: ESOPHAGOGASTRODUODENOSCOPY (EGD) WITH PROPOFOL (N/A ) COLONOSCOPY WITH PROPOFOL (N/A )  Patient Location: PACU  Anesthesia Type:General  Level of Consciousness: drowsy  Airway & Oxygen Therapy: Patient Spontanous Breathing and Patient connected to nasal cannula oxygen  Post-op Assessment: Report given to RN and Post -op Vital signs reviewed and stable  Post vital signs: Reviewed and stable  Last Vitals:  Vitals Value Taken Time  BP 110/69 03/01/19 0847  Temp 36.4 C 03/01/19 0846  Pulse 68 03/01/19 0847  Resp 17 03/01/19 0847  SpO2 97 % 03/01/19 0847  Vitals shown include unvalidated device data.  Last Pain:  Vitals:   03/01/19 0846  TempSrc:   PainSc: Asleep         Complications: No apparent anesthesia complications

## 2019-03-02 NOTE — Anesthesia Postprocedure Evaluation (Signed)
Anesthesia Post Note  Patient: Russell Wu  Procedure(s) Performed: ESOPHAGOGASTRODUODENOSCOPY (EGD) WITH PROPOFOL (N/A ) COLONOSCOPY WITH PROPOFOL (N/A )  Patient location during evaluation: PACU Anesthesia Type: General Level of consciousness: awake and alert Pain management: pain level controlled Vital Signs Assessment: post-procedure vital signs reviewed and stable Respiratory status: spontaneous breathing, nonlabored ventilation, respiratory function stable and patient connected to nasal cannula oxygen Cardiovascular status: blood pressure returned to baseline and stable Postop Assessment: no apparent nausea or vomiting Anesthetic complications: no     Last Vitals:  Vitals:   03/01/19 0906 03/01/19 0915  BP: 127/79 140/82  Pulse: 78 78  Resp: (!) 22 13  Temp:    SpO2: 95% 96%    Last Pain:  Vitals:   03/02/19 0800  TempSrc:   PainSc: 0-No pain                 Molli Barrows

## 2019-03-23 ENCOUNTER — Other Ambulatory Visit: Payer: Self-pay | Admitting: Internal Medicine

## 2019-03-23 DIAGNOSIS — R634 Abnormal weight loss: Secondary | ICD-10-CM

## 2019-03-23 DIAGNOSIS — D508 Other iron deficiency anemias: Secondary | ICD-10-CM

## 2019-04-04 ENCOUNTER — Other Ambulatory Visit: Payer: Self-pay

## 2019-04-04 ENCOUNTER — Ambulatory Visit
Admission: RE | Admit: 2019-04-04 | Discharge: 2019-04-04 | Disposition: A | Discharge status: Home / Self Care | Payer: Medicare Other | Source: Ambulatory Visit | Attending: Internal Medicine | Admitting: Internal Medicine

## 2019-04-04 DIAGNOSIS — D508 Other iron deficiency anemias: Secondary | ICD-10-CM | POA: Diagnosis present

## 2019-04-04 DIAGNOSIS — R634 Abnormal weight loss: Secondary | ICD-10-CM | POA: Diagnosis present

## 2019-04-04 LAB — POCT I-STAT CREATININE: Creatinine, Ser: 0.6 mg/dL — ABNORMAL LOW (ref 0.61–1.24)

## 2019-04-04 MED ORDER — IOHEXOL 300 MG/ML  SOLN
100.0000 mL | Freq: Once | INTRAMUSCULAR | Status: AC | PRN
  Administered 2019-04-04: 11:00:00 100 mL via INTRAVENOUS

## 2020-03-28 ENCOUNTER — Other Ambulatory Visit: Payer: Self-pay | Admitting: Internal Medicine

## 2020-03-28 DIAGNOSIS — J3801 Paralysis of vocal cords and larynx, unilateral: Secondary | ICD-10-CM

## 2020-04-16 ENCOUNTER — Ambulatory Visit
Admission: RE | Admit: 2020-04-16 | Discharge: 2020-04-16 | Disposition: A | Discharge status: Home / Self Care | Payer: Medicare Other | Source: Ambulatory Visit | Attending: Internal Medicine | Admitting: Internal Medicine

## 2020-04-16 ENCOUNTER — Other Ambulatory Visit: Payer: Self-pay

## 2020-04-16 DIAGNOSIS — J3801 Paralysis of vocal cords and larynx, unilateral: Secondary | ICD-10-CM | POA: Diagnosis present

## 2020-04-16 MED ORDER — IOHEXOL 300 MG/ML  SOLN
75.0000 mL | Freq: Once | INTRAMUSCULAR | Status: AC | PRN
  Administered 2020-04-16: 75 mL via INTRAVENOUS

## 2020-04-16 MED ORDER — GADOBUTROL 1 MMOL/ML IV SOLN
8.0000 mL | Freq: Once | INTRAVENOUS | Status: AC | PRN
  Administered 2020-04-16: 8 mL via INTRAVENOUS

## 2020-07-12 ENCOUNTER — Other Ambulatory Visit: Payer: Self-pay | Admitting: Neurosurgery

## 2020-07-12 ENCOUNTER — Other Ambulatory Visit: Payer: Self-pay | Admitting: Internal Medicine

## 2020-07-12 DIAGNOSIS — G939 Disorder of brain, unspecified: Secondary | ICD-10-CM

## 2020-07-12 DIAGNOSIS — R1319 Other dysphagia: Secondary | ICD-10-CM

## 2020-07-25 ENCOUNTER — Ambulatory Visit
Admission: RE | Admit: 2020-07-25 | Discharge: 2020-07-25 | Disposition: A | Discharge status: Home / Self Care | Payer: Medicare Other | Source: Ambulatory Visit | Attending: Neurosurgery | Admitting: Neurosurgery

## 2020-07-25 ENCOUNTER — Other Ambulatory Visit: Payer: Self-pay

## 2020-07-25 DIAGNOSIS — R1314 Dysphagia, pharyngoesophageal phase: Secondary | ICD-10-CM | POA: Diagnosis present

## 2020-07-25 DIAGNOSIS — G939 Disorder of brain, unspecified: Secondary | ICD-10-CM | POA: Diagnosis present

## 2020-07-25 DIAGNOSIS — R1319 Other dysphagia: Secondary | ICD-10-CM | POA: Insufficient documentation

## 2020-07-25 NOTE — Therapy (Addendum)
[facility] RADIOLOGY [address] [place], [place], [address] Phone: [phone]   Fax:     Modified Barium Swallow  Patient Details  Name: Russell Wu MRN: [id number] Date of Birth: [birth year] No data recorded  Encounter Date: 07/25/2020   End of Session - 07/25/20 1631    Visit Number 1    Number of Visits 1    Date for SLP Re-Evaluation 07/25/20    SLP Start Time 1300    SLP Stop Time  1400    SLP Time Calculation (min) 60 min    Activity Tolerance Patient tolerated treatment well           Past Medical History:  Diagnosis Date  . Degenerative disc disease, lumbar   . Hyperglycemia   . Hyperlipidemia   . Hypertension     Past Surgical History:  Procedure Laterality Date  . CARPAL TUNNEL RELEASE Bilateral 1995  . COLON SURGERY  1950   rectal polyp removed when 74 years old  . COLONOSCOPY WITH PROPOFOL N/A 07/02/2016   Procedure: COLONOSCOPY WITH PROPOFOL;  Surgeon: Christena Deem, MD;  Location: [facility] ENDOSCOPY;  Service: Endoscopy;  Laterality: N/A;  . COLONOSCOPY WITH PROPOFOL N/A 03/01/2019   Procedure: COLONOSCOPY WITH PROPOFOL;  Surgeon: Toledo, Boykin Nearing, MD;  Location: [facility] ENDOSCOPY;  Service: Endoscopy;  Laterality: N/A;  . ESOPHAGOGASTRODUODENOSCOPY (EGD) WITH PROPOFOL N/A 03/01/2019   Procedure: ESOPHAGOGASTRODUODENOSCOPY (EGD) WITH PROPOFOL;  Surgeon: Toledo, Boykin Nearing, MD;  Location: [facility] ENDOSCOPY;  Service: Endoscopy;  Laterality: N/A;  . JOINT REPLACEMENT Left 2014  . KNEE ARTHROSCOPY Left early 2000s  . KNEE ARTHROSCOPY Right 2002  . TONSILLECTOMY  1957    There were no vitals filed for this visit.      Subjective: Patient behavior: (alertness, ability to follow instructions, etc.): Chief complaint:dysphagia. Pt has a Baseline of oropharyngeal phase dysphagia per previous MBSS in 06/2020 which indicated moderate-severe oropharyngeal phase dysphagia at that time. Pt had been admitted to  [facility] in 06/2020 for resection of posterior fossa meningioma s/p persistent hoarseness and dysphagia symptoms for ~6 years. Pt has dx'd R TVC paralysis and was to f/u w/ ENT; unclear if pt had f/u Outpatient or HH ST services post d/c. Pt stated he was eating a "regular diet now" and not always using the R head turn strategy during swallowing. He denied any current Pulmonary issues. Noted EGD in 2020; unsure if any Baseline Reflux.  OM exam appeared grossly WFL. Native dentition.    Objective:  Radiological Procedure: A videoflouroscopic evaluation of oral-preparatory, reflex initiation, and pharyngeal phases of the swallow was performed; as well as a screening of the upper esophageal phase.  I. POSTURE: upright II. VIEW: lateral III. COMPENSATORY STRATEGIES: f/u, Dry swallows throughout eval; attempted R head turn swallow; throat clear intermittently; Time b/t trials IV. BOLUSES ADMINISTERED:  Thin Liquid: 5 trials  Nectar-thick Liquid: 1 trial  Honey-thick Liquid: NT  Puree: 3 tirals  Mechanical Soft: 2 trials V. RESULTS OF EVALUATION: A. ORAL PREPARATORY PHASE: (The lips, tongue, and velum are observed for strength and coordination)       **Overall Severity Rating: grossly WFL. Min more attention to full mastication of increased textured trials; Time taken for full mastication prior to swallowing. Oral clearing achieved w/ f/u, dry swallow independently.   B. SWALLOW INITIATION/REFLEX: (The reflex is normal if "triggered" by the time the bolus reached the base of the tongue)  **Overall Severity Rating: grossly grossly  WFL. Trial consistencies were noted to trigger the pharyngeal swallow at the level of the Valleculae w/ thin liquids just spilling over the epiglottis intermittently. For the majority of swallows/trials, pt appeared to achieve adequate airway closure.    C. PHARYNGEAL PHASE: (Pharyngeal function is normal if the bolus shows rapid, smooth, and continuous transit  through the pharynx and there is no pharyngeal residue after the swallow)  **Overall Severity Rating: MODERATE. Pharyngeal residue post initial swallow was noted w/ all trial consistencies; moreso as po intake continued -- suspect impact from increased Esophageal phase Dysmotility impacting the pharyngeal phase and/or possible fatigue factor also. Noted some reduced BOT retraction and contact to the pharyngeal wall intermittently w/ min reduced, full epiglottic inversion every swallow. Min decreased anterior movement during the pharyngeal swallow noted as well. Though w/ effortful swallowing, and a f/u, dry swallow, pt was able to significantly reduce pharyngeal residue post initial swallow. Inconsistent, laryngeal Penetration noted to occur d/t buildup of pharyngeal residue -- b/t trials. Using a Throat Clear and f/u, Dry swallow, he appeared to clear the laryngeal vestibule of Penetrated residue. A R Head Turn strategy during the swallow did not significantly reduce pharyngeal residue post swallow completion.   D. LARYNGEAL PENETRATION: (Material entering into the laryngeal inlet/vestibule but not aspirated): laryngeal penetration noted x1 During swallow w/ Thin liquids; intermittent/inconsistent laryngeal penetration Between swallows from pharyngeal Residue - often sensate to the penetration and utilized a throat clear strategy and f/u, Dry swallow which appeared successful in clearing the laryngeal penetration   E. ASPIRATION: NONE F. ESOPHAGEAL PHASE: (Screening of the upper esophagus): slow bolus motility through the viewable Cervical-Thoracic Esophageal area -- apparent bolus stasis noted. Potential thickening of the cricopharyngeus muscle w/ anterior/posterior protrusion during the swallowing. As Esophageal dysmotility continued, increased pharyngeal residue noted to occur above in the pharynx.    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 This presentation can increase risk for aspiration both during the swallow and after the swallow, also including risk for aspiration from Retrograde flow of material from the Esophagus after the swallow secondary to Esophageal Dysmotility. Following general aspiration precautions and using swallowing strategies including Small, Single bites/sips, f/u, Dry Swallows, and intermittent Throat Clearing for any potential laryngeal penetration occurring, pt reduces his risk for aspiration thus Pulmonary impact/decline. It is recommended he continue swallowing strategies/precautions w/ all oral intake and monitor his Pulmonary status for any negative sequelae from aspiration.  During the oral phase, attention to full mastication of increased textured trials; Time taken for full mastication prior to swallowing. Oral clearing achieved w/ f/u, dry swallow independently. During the pharyngeal phase, trial consistencies were noted to trigger the pharyngeal swallow at/around the level of the Valleculae w/ thin liquids just spilling over the epiglottis intermittently. For the majority of swallows/trials, pt appeared to achieve adequate airway closure. Pharyngeal residue post initial swallow was noted w/ all trial consistencies; moreso as po intake continued -- suspect impact from increased Esophageal phase Dysmotility impacting the pharyngeal phase and/or possible fatigue factor also. Also noted some reduced BOT retraction and contact to the pharyngeal wall intermittently w/ min reduced, full epiglottic inversion every swallow. Min decreased anterior movement during the pharyngeal swallow noted as well. Though w/ effortful swallowing, and a f/u, dry swallow, pt was able to significantly reduce pharyngeal residue post initial swallow. Inconsistent, laryngeal Penetration noted to occur d/t buildup of pharyngeal residue -- b/t trials. Using a Throat Clear and f/u,  Dry swallow, he appeared to  clear the laryngeal vestibule of Penetrated residue. A R Head Turn strategy during the swallow did not significantly reduce pharyngeal residue post swallow completion. NO Aspiration noted to occur during this study today. Intermittent/inconsistent laryngeal Penetration Between swallows from pharyngeal Residue did occur - pt was often sensate to the Penetration and utilized a throat clear strategy and f/u, Dry swallow which appeared successful in clearing the laryngeal penetration. During the Esophageal phase, slow bolus motility through the viewable Cervical-Thoracic Esophageal area occurred w/ apparent bolus stasis. Potential thickening of the cricopharyngeus muscle w/ anterior/posterior protrusion during the swallowing noted as well. As Esophageal dysmotility continued and stasis increased, increased pharyngeal residue noted to occur. Time was taken b/t trials to allow for f/u, Dry swallowing and Esophageal motility.     PLAN/RECOMMENDATIONS:  A. Diet: Recommend continuing his current Regular diet(per pt report, he eats regular foods, thin liquids at home now) w/ Thin liquids; general aspiration precautions; using compensatory swallowing strategies: f/u, Dry swallows, Throat Clear and f/u swallow; Small, Single bites/sips. Take Rest Breaks and Time b/t bites during meals to allow for pharyngo-esophageal motility/clearing. Recommend Pills Whole in Puree as needed for safer swallowing.  B. Swallowing Precautions: general aspiration precautions; swallowing strategies (as described above)  C. Recommended consultation to: GI for more formal assessment of Esophageal phase dysmotility; education  D. Therapy recommendations: continued ST f/u as desired focusing on pharyngeal strengthening exs; education on swallowing, dysphagia; practice and follow through w/ swallowing strategies  E. Results and recommendations were discussed w/ pt; video viewed and questions answered. Practiced the strategies as recommended.           Patient will benefit from skilled therapeutic intervention in order to improve the following deficits and impairments:   Dysphagia, pharyngoesophageal phase  Other dysphagia - Plan: DG SWALLOW FUNC OP MEDICARE SPEECH PATH, DG SWALLOW FUNC OP MEDICARE SPEECH PATH  Brain lesion - Plan: DG SWALLOW FUNC OP MEDICARE SPEECH PATH, DG SWALLOW FUNC OP MEDICARE SPEECH PATH        Problem List There are no problems to display for this patient.     Jerilynn Som, MS, CCC-SLP Speech Language Pathologist Rehab Services [phone] [facility] 07/25/2020, 4:32 PM  [place] [facility] DIAGNOSTIC RADIOLOGY [address] [place], [place], [address] Phone: [phone]   Fax:     Name: Russell Wu MRN: [id number] Date of Birth: Mar 30, 1946

## 2020-08-13 ENCOUNTER — Ambulatory Visit: Payer: Medicare Other | Attending: Internal Medicine

## 2020-08-13 ENCOUNTER — Other Ambulatory Visit: Payer: Self-pay | Admitting: Internal Medicine

## 2020-08-13 DIAGNOSIS — Z23 Encounter for immunization: Secondary | ICD-10-CM

## 2020-08-13 NOTE — Progress Notes (Signed)
   Covid-19 Vaccination Clinic  Name:  NAOKI MIGLIACCIO    MRN: [id number] DOB: [birth year]  08/13/2020  Mr. Rubens was observed post Covid-19 immunization for 15 minutes without incident. He was provided with Vaccine Information Sheet and instruction to access the V-Safe system.   Mr. Jaggers was instructed to call 911 with any severe reactions post vaccine: Marland Kitchen Difficulty breathing  . Swelling of face and throat  . A fast heartbeat  . A bad rash all over body  . Dizziness and weakness   Immunizations Administered    Name Date Dose VIS Date Route   Pfizer COVID-19 Vaccine 08/13/2020  1:16 PM 0.3 mL 07/03/2020 Intramuscular   Manufacturer: [employer], [employer]   Lot: [id number]   NDC: [address]-6

## 2021-03-20 ENCOUNTER — Encounter: Payer: Self-pay | Admitting: Ophthalmology

## 2021-04-07 NOTE — Discharge Instructions (Signed)

## 2021-04-09 ENCOUNTER — Encounter
Admission: RE | Disposition: A | Discharge status: Home / Self Care | Payer: Self-pay | Source: Home / Self Care | Attending: Ophthalmology

## 2021-04-09 ENCOUNTER — Ambulatory Visit: Payer: Medicare Other | Admitting: Anesthesiology

## 2021-04-09 ENCOUNTER — Encounter: Payer: Self-pay | Admitting: Ophthalmology

## 2021-04-09 ENCOUNTER — Other Ambulatory Visit: Payer: Self-pay

## 2021-04-09 ENCOUNTER — Ambulatory Visit
Admission: RE | Admit: 2021-04-09 | Discharge: 2021-04-09 | Disposition: A | Discharge status: Home / Self Care | Payer: Medicare Other | Attending: Ophthalmology | Admitting: Ophthalmology

## 2021-04-09 DIAGNOSIS — Z79899 Other long term (current) drug therapy: Secondary | ICD-10-CM | POA: Insufficient documentation

## 2021-04-09 DIAGNOSIS — Z7982 Long term (current) use of aspirin: Secondary | ICD-10-CM | POA: Insufficient documentation

## 2021-04-09 DIAGNOSIS — H2511 Age-related nuclear cataract, right eye: Secondary | ICD-10-CM | POA: Insufficient documentation

## 2021-04-09 DIAGNOSIS — I10 Essential (primary) hypertension: Secondary | ICD-10-CM | POA: Insufficient documentation

## 2021-04-09 HISTORY — PX: CATARACT EXTRACTION W/PHACO: SHX586

## 2021-04-09 HISTORY — DX: Presence of external hearing-aid: Z97.4

## 2021-04-09 SURGERY — PHACOEMULSIFICATION, CATARACT, WITH IOL INSERTION
Anesthesia: Monitor Anesthesia Care | Site: Eye | Laterality: Right

## 2021-04-09 MED ORDER — CYCLOPENTOLATE HCL 2 % OP SOLN
1.0000 [drp] | OPHTHALMIC | Status: DC | PRN
  Administered 2021-04-09 (×3): 1 [drp] via OPHTHALMIC

## 2021-04-09 MED ORDER — FENTANYL CITRATE (PF) 100 MCG/2ML IJ SOLN
INTRAMUSCULAR | Status: DC | PRN
  Administered 2021-04-09: 50 ug via INTRAVENOUS

## 2021-04-09 MED ORDER — SIGHTPATH DOSE#1 BSS IO SOLN
INTRAOCULAR | Status: DC | PRN
  Administered 2021-04-09: 15 mL via INTRAOCULAR

## 2021-04-09 MED ORDER — MIDAZOLAM HCL 2 MG/2ML IJ SOLN
INTRAMUSCULAR | Status: DC | PRN
  Administered 2021-04-09: 1 mg via INTRAVENOUS

## 2021-04-09 MED ORDER — SIGHTPATH DOSE#1 BSS IO SOLN
INTRAOCULAR | Status: DC | PRN
  Administered 2021-04-09: 107 mL via OPHTHALMIC

## 2021-04-09 MED ORDER — MOXIFLOXACIN HCL 0.5 % OP SOLN
OPHTHALMIC | Status: DC | PRN
  Administered 2021-04-09: 0.2 mL via OPHTHALMIC

## 2021-04-09 MED ORDER — BSS IO SOLN
INTRAOCULAR | Status: DC | PRN
  Administered 2021-04-09: 15 mL via INTRAOCULAR

## 2021-04-09 MED ORDER — LACTATED RINGERS IV SOLN: INTRAVENOUS | Status: DC

## 2021-04-09 MED ORDER — PHENYLEPHRINE HCL 10 % OP SOLN
1.0000 [drp] | OPHTHALMIC | Status: DC | PRN
  Administered 2021-04-09 (×3): 1 [drp] via OPHTHALMIC

## 2021-04-09 MED ORDER — TETRACAINE HCL 0.5 % OP SOLN
1.0000 [drp] | OPHTHALMIC | Status: DC | PRN
  Administered 2021-04-09 (×3): 1 [drp] via OPHTHALMIC

## 2021-04-09 MED ORDER — SIGHTPATH DOSE#1 BSS IO SOLN
INTRAOCULAR | Status: DC | PRN
  Administered 2021-04-09: 2 mL

## 2021-04-09 MED ORDER — SIGHTPATH DOSE#1 NA HYALUR & NA CHOND-NA HYALUR IO KIT
PACK | INTRAOCULAR | Status: DC | PRN
  Administered 2021-04-09: 1 via OPHTHALMIC

## 2021-04-09 MED ORDER — BRIMONIDINE TARTRATE-TIMOLOL 0.2-0.5 % OP SOLN
OPHTHALMIC | Status: DC | PRN
  Administered 2021-04-09: 1 [drp] via OPHTHALMIC

## 2021-04-09 SURGICAL SUPPLY — 15 items
CANNULA ANT/CHMB 27GA (MISCELLANEOUS) ×2 IMPLANT
GLOVE SURG GAMMEX PI TX LF 7.5 (GLOVE) ×2 IMPLANT
GLOVE SURG TRIUMPH 8.0 PF LTX (GLOVE) ×2 IMPLANT
GOWN STRL REUS W/ TWL LRG LVL3 (GOWN DISPOSABLE) ×2 IMPLANT
GOWN STRL REUS W/TWL LRG LVL3 (GOWN DISPOSABLE) ×4
LENS IOL TECNIS EYHANCE 19.5 (Intraocular Lens) ×2 IMPLANT
MARKER SKIN DUAL TIP RULER LAB (MISCELLANEOUS) ×2 IMPLANT
NEEDLE CAPSULORHEX 25GA (NEEDLE) ×2 IMPLANT
NEEDLE FILTER BLUNT 18X 1/2SAF (NEEDLE) ×2
NEEDLE FILTER BLUNT 18X1 1/2 (NEEDLE) ×2 IMPLANT
PACK EYE AFTER SURG (MISCELLANEOUS) ×2 IMPLANT
SYR 3ML LL SCALE MARK (SYRINGE) ×4 IMPLANT
SYR TB 1ML LUER SLIP (SYRINGE) ×2 IMPLANT
WATER STERILE IRR 250ML POUR (IV SOLUTION) ×2 IMPLANT
WIPE NON LINTING 3.25X3.25 (MISCELLANEOUS) ×2 IMPLANT

## 2021-04-09 NOTE — Anesthesia Postprocedure Evaluation (Signed)
Anesthesia Post Note  Patient: Russell Wu  Procedure(s) Performed: CATARACT EXTRACTION PHACO AND INTRAOCULAR LENS PLACEMENT (IOC) RIGHT 17.09 01:59.9 (Right: Eye)     Patient location during evaluation: PACU Anesthesia Type: MAC Level of consciousness: awake and alert Pain management: pain level controlled Vital Signs Assessment: post-procedure vital signs reviewed and stable Respiratory status: spontaneous breathing Cardiovascular status: stable Anesthetic complications: no   No notable events documented.  Gillian Scarce

## 2021-04-09 NOTE — Anesthesia Procedure Notes (Signed)
Procedure Name: MAC Date/Time: 04/09/2021 11:07 AM Performed by: Dionne Bucy, CRNA Pre-anesthesia Checklist: Patient identified, Emergency Drugs available, Suction available, Patient being monitored and Timeout performed Patient Re-evaluated:Patient Re-evaluated prior to induction Oxygen Delivery Method: Nasal cannula Placement Confirmation: positive ETCO2

## 2021-04-09 NOTE — Transfer of Care (Signed)
Immediate Anesthesia Transfer of Care Note  Patient: Russell Wu  Procedure(s) Performed: CATARACT EXTRACTION PHACO AND INTRAOCULAR LENS PLACEMENT (IOC) RIGHT 17.09 01:59.9 (Right: Eye)  Patient Location: PACU  Anesthesia Type: MAC  Level of Consciousness: awake, alert  and patient cooperative  Airway and Oxygen Therapy: Patient Spontanous Breathing and Patient connected to supplemental oxygen  Post-op Assessment: Post-op Vital signs reviewed, Patient's Cardiovascular Status Stable, Respiratory Function Stable, Patent Airway and No signs of Nausea or vomiting  Post-op Vital Signs: Reviewed and stable  Complications: No notable events documented.

## 2021-04-09 NOTE — Anesthesia Preprocedure Evaluation (Addendum)
Anesthesia Evaluation  Patient identified by MRN, date of birth, ID band Patient awake    Reviewed: Allergy & Precautions, H&P , NPO status , Patient's Chart, lab work & pertinent test results  Airway Mallampati: II  TM Distance: >3 FB Neck ROM: full    Dental no notable dental hx.    Pulmonary neg pulmonary ROS,    Pulmonary exam normal        Cardiovascular hypertension, On Medications Normal cardiovascular exam Rhythm:regular Rate:Normal     Neuro/Psych negative neurological ROS     GI/Hepatic negative GI ROS,   Endo/Other  negative endocrine ROS  Renal/GU negative Renal ROS  negative genitourinary   Musculoskeletal   Abdominal   Peds  Hematology negative hematology ROS (+)   Anesthesia Other Findings   Reproductive/Obstetrics                            Anesthesia Physical Anesthesia Plan  ASA: 2  Anesthesia Plan: MAC   Post-op Pain Management:    Induction:   PONV Risk Score and Plan: 1 and Midazolam and Treatment may vary due to age or medical condition  Airway Management Planned:   Additional Equipment:   Intra-op Plan:   Post-operative Plan:   Informed Consent: I have reviewed the patients History and Physical, chart, labs and discussed the procedure including the risks, benefits and alternatives for the proposed anesthesia with the patient or authorized representative who has indicated his/her understanding and acceptance.       Plan Discussed with:   Anesthesia Plan Comments:         Anesthesia Quick Evaluation

## 2021-04-09 NOTE — Op Note (Signed)
  LOCATION:  [facility]   PREOPERATIVE DIAGNOSIS:    Nuclear sclerotic cataract right eye. H25.11   POSTOPERATIVE DIAGNOSIS:  Nuclear sclerotic cataract right eye.     PROCEDURE:  Phacoemusification with posterior chamber intraocular lens placement of the right eye   ULTRASOUND TIME: Procedure(s): CATARACT EXTRACTION PHACO AND INTRAOCULAR LENS PLACEMENT (IOC) RIGHT 17.09 01:59.9 (Right)  LENS:   Implant Name Type Inv. Item Serial No. Manufacturer Lot No. LRB No. Used Action  LENS IOL TECNIS EYHANCE 19.5 - [id number] Intraocular Lens LENS IOL TECNIS EYHANCE 19.5 [id number] JOHNSON   Right 1 Implanted         SURGEON:  Deirdre Evener, MD   ANESTHESIA:  Topical with tetracaine drops and 2% Xylocaine jelly, augmented with 1% preservative-free intracameral lidocaine.    COMPLICATIONS:  None.   DESCRIPTION OF PROCEDURE:  The patient was identified in the holding room and transported to the operating room and placed in the supine position under the operating microscope.  The right eye was identified as the operative eye and it was prepped and draped in the usual sterile ophthalmic fashion.   A 1 millimeter clear-corneal paracentesis was made at the 12:00 position.  0.5 ml of preservative-free 1% lidocaine was injected into the anterior chamber. The anterior chamber was filled with Viscoat viscoelastic.  A 2.4 millimeter keratome was used to make a near-clear corneal incision at the 9:00 position.  A curvilinear capsulorrhexis was made with a cystotome and capsulorrhexis forceps.  Balanced salt solution was used to hydrodissect and hydrodelineate the nucleus.   Phacoemulsification was then used in stop and chop fashion to remove the lens nucleus and epinucleus.  The remaining cortex was then removed using the irrigation and aspiration handpiece. Provisc was then placed into the capsular bag to distend it for lens placement.  A lens was then injected into the capsular bag.   The remaining viscoelastic was aspirated.   Wounds were hydrated with balanced salt solution.  The anterior chamber was inflated to a physiologic pressure with balanced salt solution.  No wound leaks were noted. Vigamox 0.2 ml of a 1mg  per ml solution was injected into the anterior chamber for a dose of 0.2 mg of intracameral antibiotic at the completion of the case.   Timolol and Brimonidine drops were applied to the eye.  The patient was taken to the recovery room in stable condition without complications of anesthesia or surgery.   Russell Wu 04/09/2021, 11:29 AM

## 2021-04-09 NOTE — H&P (Signed)
[facility]   Primary Care Physician:  Russell Arbour, MD Ophthalmologist: Dr. Lockie Wu  Pre-Procedure History & Physical: HPI:  Russell Wu is a 75 y.o. male here for ophthalmic surgery.   Past Medical History:  Diagnosis Date   Degenerative disc disease, lumbar    Hyperglycemia    Hyperlipidemia    Hypertension    Wears hearing aid in both ears     Past Surgical History:  Procedure Laterality Date   CARPAL TUNNEL RELEASE Bilateral 1995   COLON SURGERY  1950   rectal polyp removed when 75 years old   COLONOSCOPY WITH PROPOFOL N/A 07/02/2016   Procedure: COLONOSCOPY WITH PROPOFOL;  Surgeon: Russell Deem, MD;  Location: [facility] ENDOSCOPY;  Service: Endoscopy;  Laterality: N/A;   COLONOSCOPY WITH PROPOFOL N/A 03/01/2019   Procedure: COLONOSCOPY WITH PROPOFOL;  Surgeon: Russell Wu, Russell Nearing, MD;  Location: [facility] ENDOSCOPY;  Service: Endoscopy;  Laterality: N/A;   ESOPHAGOGASTRODUODENOSCOPY (EGD) WITH PROPOFOL N/A 03/01/2019   Procedure: ESOPHAGOGASTRODUODENOSCOPY (EGD) WITH PROPOFOL;  Surgeon: Russell Wu, Russell Nearing, MD;  Location: [facility] ENDOSCOPY;  Service: Endoscopy;  Laterality: N/A;   JOINT REPLACEMENT Left 2014   KNEE ARTHROSCOPY Left early 2000s   KNEE ARTHROSCOPY Right 2002   TONSILLECTOMY  1957    Prior to Admission medications   Medication Sig Start Date End Date Taking? Authorizing Provider  amLODipine (NORVASC) 10 MG tablet Take 10 mg by mouth daily.   Yes [provider]  b complex vitamins capsule Take 1 capsule by mouth daily.   Yes [provider]  doxazosin (CARDURA) 2 MG tablet Take 2 mg by mouth daily.   Yes [provider]  ezetimibe (ZETIA) 10 MG tablet Take 10 mg by mouth daily.   Yes [provider]  hydrochlorothiazide (HYDRODIURIL) 25 MG tablet Take 12.5 mg by mouth daily.   Yes [provider]  Iron-Vit C-Vit B12-Folic Acid (IRON 100 PLUS PO) Take by mouth daily.   Yes [provider]   losartan (COZAAR) 100 MG tablet Take 100 mg by mouth daily.   Yes [provider]  magnesium oxide (MAG-OX) 400 MG tablet Take 400 mg by mouth daily.   Yes [provider]  potassium chloride SA (K-DUR,KLOR-CON) 20 MEQ tablet Take 20 mEq by mouth 2 (two) times daily.   Yes [provider]  tiZANidine (ZANAFLEX) 4 MG tablet Take 4 mg by mouth daily.   Yes [provider]  vitamin B-12 (CYANOCOBALAMIN) 500 MCG tablet Take 500 mcg by mouth daily.   Yes [provider]  acetaminophen (TYLENOL) 500 MG tablet Take 500 mg by mouth every 4 (four) hours as needed. Patient not taking: Reported on 03/20/2021    [provider]  aspirin 325 MG tablet Take 325 mg by mouth daily. Patient not taking: Reported on 03/20/2021    [provider]  atorvastatin (LIPITOR) 20 MG tablet Take 20 mg by mouth daily. Patient not taking: Reported on 03/20/2021    [provider]  clindamycin (CLEOCIN) 150 MG capsule Take by mouth daily. Patient not taking: Reported on 03/20/2021    [provider]  COVID-19 mRNA vaccine, Pfizer, 30 MCG/0.3ML injection USE AS DIRECTED Patient not taking: Reported on 03/20/2021 08/13/20 08/13/21  Russell Munson, MD  naproxen sodium (ANAPROX) 220 MG tablet Take 220 mg by mouth 2 (two) times daily with a meal. Patient not taking: Reported on 03/20/2021    [provider]    Allergies as of 01/06/2021 -  Review Complete 04/16/2020  Allergen Reaction Noted   Lincocin [lincomycin hcl]  07/01/2016   Penicillins  07/01/2016   Sulfa antibiotics  07/01/2016    History reviewed. No pertinent family history.  Social History   Socioeconomic History   Marital status: Married    Spouse name: Not on file   Number of children: Not on file   Years of education: Not on file   Highest education level: Not on file  Occupational History   Not on file  Tobacco Use   Smoking status: Never   Smokeless tobacco: Never   Vaping Use   Vaping Use: Never used  Substance and Sexual Activity   Alcohol use: Yes    Alcohol/week: 1.0 standard drink    Types: 1 Shots of liquor per week    Comment: daily   Drug use: No   Sexual activity: Not on file  Other Topics Concern   Not on file  Social History Narrative   Not on file   Social Determinants of Health   Financial Resource Strain: Not on file  Food Insecurity: Not on file  Transportation Needs: Not on file  Physical Activity: Not on file  Stress: Not on file  Social Connections: Not on file  Intimate Partner Violence: Not on file    Review of Systems: See HPI, otherwise negative ROS  Physical Exam: Ht 5\' 7"  (1.702 m)   Wt 82.6 kg   BMI 28.51 kg/m  General:   Alert,  pleasant and cooperative in NAD Head:  Normocephalic and atraumatic. Lungs:  Clear to auscultation.    Heart:  Regular rate and rhythm.   Impression/Plan: is here for ophthalmic surgery.  Risks, benefits, limitations, and alternatives regarding ophthalmic surgery have been reviewed with the patient.  Questions have been answered.  All parties agreeable.   Russell Salm, MD  04/09/2021, 10:11 AM

## 2021-04-10 ENCOUNTER — Encounter: Payer: Self-pay | Admitting: Ophthalmology

## 2021-06-12 ENCOUNTER — Encounter: Payer: Self-pay | Admitting: Ophthalmology

## 2021-06-23 NOTE — Discharge Instructions (Signed)

## 2021-06-24 ENCOUNTER — Other Ambulatory Visit (HOSPITAL_BASED_OUTPATIENT_CLINIC_OR_DEPARTMENT_OTHER): Payer: Self-pay | Admitting: Neurosurgery

## 2021-06-24 ENCOUNTER — Other Ambulatory Visit: Payer: Self-pay | Admitting: Neurosurgery

## 2021-06-24 DIAGNOSIS — Z9889 Other specified postprocedural states: Secondary | ICD-10-CM

## 2021-06-25 ENCOUNTER — Ambulatory Visit: Payer: Medicare Other | Admitting: Anesthesiology

## 2021-06-25 ENCOUNTER — Other Ambulatory Visit: Payer: Self-pay

## 2021-06-25 ENCOUNTER — Encounter
Admission: RE | Disposition: A | Discharge status: Home / Self Care | Payer: Self-pay | Source: Home / Self Care | Attending: Ophthalmology

## 2021-06-25 ENCOUNTER — Ambulatory Visit
Admission: RE | Admit: 2021-06-25 | Discharge: 2021-06-25 | Disposition: A | Discharge status: Home / Self Care | Payer: Medicare Other | Attending: Ophthalmology | Admitting: Ophthalmology

## 2021-06-25 ENCOUNTER — Encounter: Payer: Self-pay | Admitting: Ophthalmology

## 2021-06-25 DIAGNOSIS — Z881 Allergy status to other antibiotic agents status: Secondary | ICD-10-CM | POA: Diagnosis not present

## 2021-06-25 DIAGNOSIS — H2512 Age-related nuclear cataract, left eye: Secondary | ICD-10-CM | POA: Diagnosis not present

## 2021-06-25 DIAGNOSIS — Z7982 Long term (current) use of aspirin: Secondary | ICD-10-CM | POA: Insufficient documentation

## 2021-06-25 DIAGNOSIS — Z880 Allergy status to penicillin: Secondary | ICD-10-CM | POA: Insufficient documentation

## 2021-06-25 DIAGNOSIS — Z882 Allergy status to sulfonamides status: Secondary | ICD-10-CM | POA: Diagnosis not present

## 2021-06-25 DIAGNOSIS — Z79899 Other long term (current) drug therapy: Secondary | ICD-10-CM | POA: Insufficient documentation

## 2021-06-25 HISTORY — PX: CATARACT EXTRACTION W/PHACO: SHX586

## 2021-06-25 SURGERY — PHACOEMULSIFICATION, CATARACT, WITH IOL INSERTION
Anesthesia: Monitor Anesthesia Care | Site: Eye | Laterality: Left

## 2021-06-25 MED ORDER — MIDAZOLAM HCL 2 MG/2ML IJ SOLN
INTRAMUSCULAR | Status: DC | PRN
  Administered 2021-06-25: 2 mg via INTRAVENOUS

## 2021-06-25 MED ORDER — SIGHTPATH DOSE#1 BSS IO SOLN
INTRAOCULAR | Status: DC | PRN
  Administered 2021-06-25: 15 mL

## 2021-06-25 MED ORDER — TETRACAINE HCL 0.5 % OP SOLN
1.0000 [drp] | OPHTHALMIC | Status: DC | PRN
  Administered 2021-06-25 (×3): 1 [drp] via OPHTHALMIC

## 2021-06-25 MED ORDER — SIGHTPATH DOSE#1 BSS IO SOLN
INTRAOCULAR | Status: DC | PRN
  Administered 2021-06-25: 1 mL

## 2021-06-25 MED ORDER — MOXIFLOXACIN HCL 0.5 % OP SOLN
OPHTHALMIC | Status: DC | PRN
  Administered 2021-06-25: 0.2 mL via OPHTHALMIC

## 2021-06-25 MED ORDER — BRIMONIDINE TARTRATE-TIMOLOL 0.2-0.5 % OP SOLN
OPHTHALMIC | Status: DC | PRN
  Administered 2021-06-25: 1 [drp] via OPHTHALMIC

## 2021-06-25 MED ORDER — LACTATED RINGERS IV SOLN: INTRAVENOUS | Status: DC

## 2021-06-25 MED ORDER — SIGHTPATH DOSE#1 BSS IO SOLN
INTRAOCULAR | Status: DC | PRN
  Administered 2021-06-25: 96 mL via OPHTHALMIC

## 2021-06-25 MED ORDER — SIGHTPATH DOSE#1 NA HYALUR & NA CHOND-NA HYALUR IO KIT
PACK | INTRAOCULAR | Status: DC | PRN
  Administered 2021-06-25: 1 via OPHTHALMIC

## 2021-06-25 MED ORDER — ARMC OPHTHALMIC DILATING DROPS
1.0000 "application " | OPHTHALMIC | Status: DC | PRN
  Administered 2021-06-25 (×3): 1 via OPHTHALMIC

## 2021-06-25 MED ORDER — FENTANYL CITRATE (PF) 100 MCG/2ML IJ SOLN
INTRAMUSCULAR | Status: DC | PRN
  Administered 2021-06-25: 50 ug via INTRAVENOUS

## 2021-06-25 SURGICAL SUPPLY — 15 items
CANNULA ANT/CHMB 27GA (MISCELLANEOUS) IMPLANT
GLOVE SRG 8 PF TXTR STRL LF DI (GLOVE) ×1 IMPLANT
GLOVE SURG ENC TEXT LTX SZ7.5 (GLOVE) ×2 IMPLANT
GLOVE SURG UNDER POLY LF SZ8 (GLOVE) ×2
GOWN STRL REUS W/ TWL LRG LVL3 (GOWN DISPOSABLE) ×2 IMPLANT
GOWN STRL REUS W/TWL LRG LVL3 (GOWN DISPOSABLE) ×4
LENS IOL TECNIS EYHANCE 20.5 (Intraocular Lens) ×2 IMPLANT
MARKER SKIN DUAL TIP RULER LAB (MISCELLANEOUS) ×2 IMPLANT
NEEDLE FILTER BLUNT 18X 1/2SAF (NEEDLE) ×2
NEEDLE FILTER BLUNT 18X1 1/2 (NEEDLE) ×2 IMPLANT
PACK EYE AFTER SURG (MISCELLANEOUS) IMPLANT
SYR 3ML LL SCALE MARK (SYRINGE) ×4 IMPLANT
SYR TB 1ML LUER SLIP (SYRINGE) ×2 IMPLANT
WATER STERILE IRR 250ML POUR (IV SOLUTION) ×2 IMPLANT
WIPE NON LINTING 3.25X3.25 (MISCELLANEOUS) ×2 IMPLANT

## 2021-06-25 NOTE — H&P (Signed)
[facility]   Primary Care Physician:  Marguarite Arbour, MD Ophthalmologist: Dr. Lockie Mola  Pre-Procedure History & Physical: HPI:  Russell Wu is a 75 y.o. male here for ophthalmic surgery.   Past Medical History:  Diagnosis Date   Degenerative disc disease, lumbar    Hyperglycemia    Hyperlipidemia    Hypertension    Wears hearing aid in both ears     Past Surgical History:  Procedure Laterality Date   CARPAL TUNNEL RELEASE Bilateral 1995   CATARACT EXTRACTION W/PHACO Right 04/09/2021   Procedure: CATARACT EXTRACTION PHACO AND INTRAOCULAR LENS PLACEMENT (IOC) RIGHT 17.09 01:59.9;  Surgeon: Lockie Mola, MD;  Location: [facility];  Service: Ophthalmology;  Laterality: Right;   COLON SURGERY  1950   rectal polyp removed when 75 years old   COLONOSCOPY WITH PROPOFOL N/A 07/02/2016   Procedure: COLONOSCOPY WITH PROPOFOL;  Surgeon: Christena Deem, MD;  Location: [facility] ENDOSCOPY;  Service: Endoscopy;  Laterality: N/A;   COLONOSCOPY WITH PROPOFOL N/A 03/01/2019   Procedure: COLONOSCOPY WITH PROPOFOL;  Surgeon: Toledo, Boykin Nearing, MD;  Location: [facility] ENDOSCOPY;  Service: Endoscopy;  Laterality: N/A;   ESOPHAGOGASTRODUODENOSCOPY (EGD) WITH PROPOFOL N/A 03/01/2019   Procedure: ESOPHAGOGASTRODUODENOSCOPY (EGD) WITH PROPOFOL;  Surgeon: Toledo, Boykin Nearing, MD;  Location: [facility] ENDOSCOPY;  Service: Endoscopy;  Laterality: N/A;   JOINT REPLACEMENT Left 2014   KNEE ARTHROSCOPY Left early 2000s   KNEE ARTHROSCOPY Right 2002   TONSILLECTOMY  1957    Prior to Admission medications   Medication Sig Start Date End Date Taking? Authorizing Provider  amLODipine (NORVASC) 10 MG tablet Take 10 mg by mouth daily.   Yes [provider]  b complex vitamins capsule Take 1 capsule by mouth daily.   Yes [provider]  doxazosin (CARDURA) 2 MG tablet Take 2 mg by mouth daily.   Yes [provider]  ezetimibe (ZETIA) 10 MG tablet Take 10 mg  by mouth daily.   Yes [provider]  hydrochlorothiazide (HYDRODIURIL) 25 MG tablet Take 12.5 mg by mouth daily.   Yes [provider]  Iron-Vit C-Vit B12-Folic Acid (IRON 100 PLUS PO) Take by mouth daily.   Yes [provider]  losartan (COZAAR) 100 MG tablet Take 100 mg by mouth daily.   Yes [provider]  magnesium oxide (MAG-OX) 400 MG tablet Take 400 mg by mouth daily.   Yes [provider]  potassium chloride SA (K-DUR,KLOR-CON) 20 MEQ tablet Take 20 mEq by mouth 2 (two) times daily.   Yes [provider]  tiZANidine (ZANAFLEX) 4 MG tablet Take 4 mg by mouth daily.   Yes [provider]  vitamin B-12 (CYANOCOBALAMIN) 500 MCG tablet Take 500 mcg by mouth daily.   Yes [provider]  acetaminophen (TYLENOL) 500 MG tablet Take 500 mg by mouth every 4 (four) hours as needed. Patient not taking: Reported on 03/20/2021    [provider]  aspirin 325 MG tablet Take 325 mg by mouth daily. Patient not taking: Reported on 03/20/2021    [provider]  atorvastatin (LIPITOR) 20 MG tablet Take 20 mg by mouth daily. Patient not taking: Reported on 03/20/2021    [provider]  clindamycin (CLEOCIN) 150 MG capsule Take by mouth daily. Patient not taking: Reported on 03/20/2021    [provider]  COVID-19 mRNA vaccine, Pfizer, 30 MCG/0.3ML injection USE AS DIRECTED Patient not taking: Reported on 03/20/2021 08/13/20 08/13/21  Judyann Munson, MD  naproxen sodium (ANAPROX) 220 MG tablet Take 220 mg by mouth 2 (two) times daily with a meal. Patient not taking: Reported on 03/20/2021    [provider]    Allergies as of 04/16/2021 - Review Complete 04/09/2021  Allergen Reaction Noted   Penicillins  07/01/2016   Sulfa antibiotics  07/01/2016   Lincocin [lincomycin hcl] Rash 07/01/2016    History reviewed. No pertinent family history.  Social History   Socioeconomic History   Marital  status: Married    Spouse name: Not on file   Number of children: Not on file   Years of education: Not on file   Highest education level: Not on file  Occupational History   Not on file  Tobacco Use   Smoking status: Never   Smokeless tobacco: Never  Vaping Use   Vaping Use: Never used  Substance and Sexual Activity   Alcohol use: Yes    Alcohol/week: 1.0 standard drink    Types: 1 Shots of liquor per week    Comment: daily   Drug use: No   Sexual activity: Not on file  Other Topics Concern   Not on file  Social History Narrative   Not on file   Social Determinants of Health   Financial Resource Strain: Not on file  Food Insecurity: Not on file  Transportation Needs: Not on file  Physical Activity: Not on file  Stress: Not on file  Social Connections: Not on file  Intimate Partner Violence: Not on file    Review of Systems: See HPI, otherwise negative ROS  Physical Exam: BP (!) 143/92   Pulse 74   Temp (!) 97.5 F (36.4 C) (Temporal)   Resp 16   Ht 5\' 7"  (1.702 m)   Wt 82.6 kg   SpO2 96%   BMI 28.51 kg/m  General:   Alert,  pleasant and cooperative in NAD Head:  Normocephalic and atraumatic. Lungs:  Clear to auscultation.    Heart:  Regular rate and rhythm.   Impression/Plan: is here for ophthalmic surgery.  Risks, benefits, limitations, and alternatives regarding ophthalmic surgery have been reviewed with the patient.  Questions have been answered.  All parties agreeable.   [name], MD  06/25/2021, 8:38 AM

## 2021-06-25 NOTE — Op Note (Signed)
OPERATIVE NOTE  Russell Wu [id number] 06/25/2021   PREOPERATIVE DIAGNOSIS:  Nuclear sclerotic cataract left eye. H25.12   POSTOPERATIVE DIAGNOSIS:    Nuclear sclerotic cataract left eye.     PROCEDURE:  Phacoemusification with posterior chamber intraocular lens placement of the left eye  Ultrasound time: Procedure(s): CATARACT EXTRACTION PHACO AND INTRAOCULAR LENS PLACEMENT (IOC) LEFT 21.26 02:05.1 (Left)  LENS:   Implant Name Type Inv. Item Serial No. Manufacturer Lot No. LRB No. Used Action  LENS IOL TECNIS EYHANCE 20.5 - [id number] Intraocular Lens LENS IOL TECNIS EYHANCE 20.5 [id number] JOHNSON   Left 1 Implanted      SURGEON:  Deirdre Evener, MD   ANESTHESIA:  Topical with tetracaine drops and 2% Xylocaine jelly, augmented with 1% preservative-free intracameral lidocaine.    COMPLICATIONS:  None.   DESCRIPTION OF PROCEDURE:  The patient was identified in the holding room and transported to the operating room and placed in the supine position under the operating microscope.  The left eye was identified as the operative eye and it was prepped and draped in the usual sterile ophthalmic fashion.   A 1 millimeter clear-corneal paracentesis was made at the 1:30 position.  0.5 ml of preservative-free 1% lidocaine was injected into the anterior chamber.  The anterior chamber was filled with Viscoat viscoelastic.  A 2.4 millimeter keratome was used to make a near-clear corneal incision at the 10:30 position.  .  A curvilinear capsulorrhexis was made with a cystotome and capsulorrhexis forceps.  Balanced salt solution was used to hydrodissect and hydrodelineate the nucleus.   Phacoemulsification was then used in stop and chop fashion to remove the lens nucleus and epinucleus.  The remaining cortex was then removed using the irrigation and aspiration handpiece. Provisc was then placed into the capsular bag to distend it for lens placement.  A lens was then injected into the  capsular bag.  The remaining viscoelastic was aspirated.   Wounds were hydrated with balanced salt solution.  The anterior chamber was inflated to a physiologic pressure with balanced salt solution.  No wound leaks were noted. Vigamox 0.2 ml of a 1mg  per ml solution was injected into the anterior chamber for a dose of 0.2 mg of intracameral antibiotic at the completion of the case.   Timolol and Brimonidine drops were applied to the eye.  The patient was taken to the recovery room in stable condition without complications of anesthesia or surgery.  [name] 06/25/2021, 10:01 AM

## 2021-06-25 NOTE — Anesthesia Postprocedure Evaluation (Signed)
Anesthesia Post Note  Patient: Russell Wu  Procedure(s) Performed: CATARACT EXTRACTION PHACO AND INTRAOCULAR LENS PLACEMENT (IOC) LEFT 21.26 02:05.1 (Left: Eye)     Patient location during evaluation: PACU Anesthesia Type: MAC Level of consciousness: awake and alert Pain management: pain level controlled Vital Signs Assessment: post-procedure vital signs reviewed and stable Respiratory status: spontaneous breathing, nonlabored ventilation, respiratory function stable and patient connected to nasal cannula oxygen Cardiovascular status: stable and blood pressure returned to baseline Postop Assessment: no apparent nausea or vomiting Anesthetic complications: no   No notable events documented.  Sinda Du

## 2021-06-25 NOTE — Anesthesia Preprocedure Evaluation (Signed)
Anesthesia Evaluation  Patient identified by MRN, date of birth, ID band Patient awake    Reviewed: Allergy & Precautions, H&P , NPO status , Patient's Chart, lab work & pertinent test results  Airway Mallampati: II  TM Distance: >3 FB Neck ROM: full    Dental no notable dental hx.    Pulmonary neg pulmonary ROS,    Pulmonary exam normal breath sounds clear to auscultation       Cardiovascular Exercise Tolerance: Good hypertension, On Medications Normal cardiovascular exam Rhythm:regular Rate:Normal     Neuro/Psych negative neurological ROS     GI/Hepatic negative GI ROS,   Endo/Other  negative endocrine ROS  Renal/GU negative Renal ROS  negative genitourinary   Musculoskeletal  (+) Arthritis ,   Abdominal Normal abdominal exam  (+) - obese,  Abdomen: soft.    Peds  Hematology negative hematology ROS (+)   Anesthesia Other Findings   Reproductive/Obstetrics                             Anesthesia Physical  Anesthesia Plan  ASA: 2  Anesthesia Plan: MAC   Post-op Pain Management:    Induction:   PONV Risk Score and Plan: 1 and Midazolam and Treatment may vary due to age or medical condition  Airway Management Planned: Nasal Cannula and Natural Airway  Additional Equipment:   Intra-op Plan:   Post-operative Plan:   Informed Consent: I have reviewed the patients History and Physical, chart, labs and discussed the procedure including the risks, benefits and alternatives for the proposed anesthesia with the patient or authorized representative who has indicated his/her understanding and acceptance.       Plan Discussed with: CRNA  Anesthesia Plan Comments:         Anesthesia Quick Evaluation  There are no problems to display for this patient.   No flowsheet data found. BMP Latest Ref Rng & Units 04/04/2019  Creatinine 0.61 - 1.24 mg/dL 0.60(L)    Risks and  benefits of anesthesia discussed at length, patient or surrogate demonstrates understanding. Appropriately NPO. Plan to proceed with anesthesia.  Champ Mungo, MD 06/25/21

## 2021-06-25 NOTE — Transfer of Care (Signed)
Immediate Anesthesia Transfer of Care Note  Patient: Russell Wu  Procedure(s) Performed: CATARACT EXTRACTION PHACO AND INTRAOCULAR LENS PLACEMENT (IOC) LEFT 21.26 02:05.1 (Left: Eye)  Patient Location: PACU  Anesthesia Type: MAC  Level of Consciousness: awake, alert  and patient cooperative  Airway and Oxygen Therapy: Patient Spontanous Breathing and Patient connected to supplemental oxygen  Post-op Assessment: Post-op Vital signs reviewed, Patient's Cardiovascular Status Stable, Respiratory Function Stable, Patent Airway and No signs of Nausea or vomiting  Post-op Vital Signs: Reviewed and stable  Complications: No notable events documented.

## 2021-06-26 ENCOUNTER — Encounter: Payer: Self-pay | Admitting: Ophthalmology

## 2021-07-08 ENCOUNTER — Ambulatory Visit: Payer: Medicare Other

## 2021-07-25 ENCOUNTER — Ambulatory Visit
Admission: RE | Admit: 2021-07-25 | Discharge: 2021-07-25 | Disposition: A | Discharge status: Home / Self Care | Payer: Medicare Other | Source: Ambulatory Visit | Attending: Neurosurgery | Admitting: Neurosurgery

## 2021-07-25 DIAGNOSIS — Z9889 Other specified postprocedural states: Secondary | ICD-10-CM

## 2021-07-25 MED ORDER — GADOBUTROL 1 MMOL/ML IV SOLN
7.5000 mL | Freq: Once | INTRAVENOUS | Status: AC | PRN
  Administered 2021-07-25: 7.5 mL via INTRAVENOUS

## 2022-07-06 ENCOUNTER — Telehealth: Payer: Self-pay

## 2022-07-06 NOTE — Telephone Encounter (Signed)
He last saw Dr Lacinda Axon 07/2021 and that note says follow up in 1 year with repeat MRI.   He is on Dr Jonathon Jordan schedule 07/21/22 and he is on Dr Rhea Bleacher schedule 08/04/22.  I don't believe he needs to see both of them. Please confirm he is OK with canceling the appointment with Dr Izora Ribas since he is seeing Dr Lacinda Axon.

## 2022-07-07 NOTE — Telephone Encounter (Signed)
Left message to call back  

## 2022-07-07 NOTE — Telephone Encounter (Signed)
He cancelled with Dr.Yarbrough.

## 2022-07-30 ENCOUNTER — Other Ambulatory Visit: Payer: Self-pay | Admitting: Orthopedic Surgery

## 2022-07-30 DIAGNOSIS — M25562 Pain in left knee: Secondary | ICD-10-CM

## 2022-07-30 DIAGNOSIS — M1712 Unilateral primary osteoarthritis, left knee: Secondary | ICD-10-CM

## 2022-08-04 ENCOUNTER — Ambulatory Visit: Payer: Medicare Other | Admitting: Neurosurgery

## 2022-08-18 ENCOUNTER — Ambulatory Visit
Admission: RE | Admit: 2022-08-18 | Discharge: 2022-08-18 | Disposition: A | Discharge status: Home / Self Care | Payer: Medicare Other | Source: Ambulatory Visit | Attending: Orthopedic Surgery | Admitting: Orthopedic Surgery

## 2022-08-18 DIAGNOSIS — M1712 Unilateral primary osteoarthritis, left knee: Secondary | ICD-10-CM | POA: Diagnosis present

## 2022-08-18 DIAGNOSIS — M25562 Pain in left knee: Secondary | ICD-10-CM | POA: Insufficient documentation

## 2023-02-13 ENCOUNTER — Emergency Department
Admission: EM | Admit: 2023-02-13 | Discharge: 2023-02-13 | Disposition: A | Discharge status: Home / Self Care | Payer: Medicare Other

## 2023-06-08 ENCOUNTER — Encounter
Admission: RE | Disposition: A | Discharge status: Home / Self Care | Payer: Self-pay | Source: Home / Self Care | Attending: Internal Medicine

## 2023-06-08 ENCOUNTER — Other Ambulatory Visit: Payer: Self-pay

## 2023-06-08 ENCOUNTER — Ambulatory Visit
Admission: RE | Admit: 2023-06-08 | Discharge: 2023-06-08 | Disposition: A | Discharge status: Home / Self Care | Payer: Medicare Other | Attending: Internal Medicine | Admitting: Internal Medicine

## 2023-06-08 ENCOUNTER — Encounter: Payer: Self-pay | Admitting: Internal Medicine

## 2023-06-08 DIAGNOSIS — R9430 Abnormal result of cardiovascular function study, unspecified: Secondary | ICD-10-CM

## 2023-06-08 DIAGNOSIS — I2510 Atherosclerotic heart disease of native coronary artery without angina pectoris: Secondary | ICD-10-CM | POA: Diagnosis not present

## 2023-06-08 DIAGNOSIS — I2584 Coronary atherosclerosis due to calcified coronary lesion: Secondary | ICD-10-CM | POA: Diagnosis not present

## 2023-06-08 HISTORY — PX: LEFT HEART CATH AND CORONARY ANGIOGRAPHY: CATH118249

## 2023-06-08 SURGERY — LEFT HEART CATH AND CORONARY ANGIOGRAPHY
Anesthesia: Moderate Sedation | Laterality: Left

## 2023-06-08 MED ORDER — ONDANSETRON HCL 4 MG/2ML IJ SOLN: 4.0000 mg | Freq: Four times a day (QID) | INTRAMUSCULAR | Status: DC | PRN

## 2023-06-08 MED ORDER — HEPARIN SODIUM (PORCINE) 1000 UNIT/ML IJ SOLN
INTRAMUSCULAR | Status: AC
  Filled 2023-06-08: qty 10

## 2023-06-08 MED ORDER — HEPARIN SODIUM (PORCINE) 1000 UNIT/ML IJ SOLN
INTRAMUSCULAR | Status: DC | PRN
  Administered 2023-06-08: 4500 [IU] via INTRAVENOUS

## 2023-06-08 MED ORDER — FENTANYL CITRATE (PF) 100 MCG/2ML IJ SOLN
INTRAMUSCULAR | Status: DC | PRN
  Administered 2023-06-08: 25 ug via INTRAVENOUS

## 2023-06-08 MED ORDER — MIDAZOLAM HCL 2 MG/2ML IJ SOLN
INTRAMUSCULAR | Status: AC
  Filled 2023-06-08: qty 2

## 2023-06-08 MED ORDER — HYDRALAZINE HCL 20 MG/ML IJ SOLN: 10.0000 mg | INTRAMUSCULAR | Status: DC | PRN

## 2023-06-08 MED ORDER — VERAPAMIL HCL 2.5 MG/ML IV SOLN
INTRAVENOUS | Status: AC
  Filled 2023-06-08: qty 2

## 2023-06-08 MED ORDER — MIDAZOLAM HCL 2 MG/2ML IJ SOLN
INTRAMUSCULAR | Status: DC | PRN
  Administered 2023-06-08: 1 mg via INTRAVENOUS

## 2023-06-08 MED ORDER — FENTANYL CITRATE (PF) 100 MCG/2ML IJ SOLN
INTRAMUSCULAR | Status: AC
  Filled 2023-06-08: qty 2

## 2023-06-08 MED ORDER — SODIUM CHLORIDE 0.9% FLUSH: 3.0000 mL | INTRAVENOUS | Status: DC | PRN

## 2023-06-08 MED ORDER — SODIUM CHLORIDE 0.9% FLUSH
3.0000 mL | Freq: Two times a day (BID) | INTRAVENOUS | Status: DC
  Administered 2023-06-08: 3 mL via INTRAVENOUS

## 2023-06-08 MED ORDER — ACETAMINOPHEN 325 MG PO TABS: 650.0000 mg | ORAL_TABLET | ORAL | Status: DC | PRN

## 2023-06-08 MED ORDER — LABETALOL HCL 5 MG/ML IV SOLN: 10.0000 mg | INTRAVENOUS | Status: DC | PRN

## 2023-06-08 MED ORDER — SODIUM CHLORIDE 0.9% FLUSH: 3.0000 mL | Freq: Two times a day (BID) | INTRAVENOUS | Status: DC

## 2023-06-08 MED ORDER — SODIUM CHLORIDE 0.9 % WEIGHT BASED INFUSION: 1.0000 mL/kg/h | INTRAVENOUS | Status: DC

## 2023-06-08 MED ORDER — HEPARIN (PORCINE) IN NACL 1000-0.9 UT/500ML-% IV SOLN
INTRAVENOUS | Status: AC
  Filled 2023-06-08: qty 1000

## 2023-06-08 MED ORDER — ASPIRIN 81 MG PO CHEW: 81.0000 mg | CHEWABLE_TABLET | ORAL | Status: DC

## 2023-06-08 MED ORDER — SODIUM CHLORIDE 0.9 % IV SOLN: 250.0000 mL | INTRAVENOUS | Status: DC | PRN

## 2023-06-08 MED ORDER — LIDOCAINE HCL (PF) 1 % IJ SOLN
INTRAMUSCULAR | Status: DC | PRN
  Administered 2023-06-08: 2 mL

## 2023-06-08 MED ORDER — VERAPAMIL HCL 2.5 MG/ML IV SOLN
INTRAVENOUS | Status: DC | PRN
  Administered 2023-06-08: 2.5 mg via INTRA_ARTERIAL

## 2023-06-08 MED ORDER — SODIUM CHLORIDE 0.9 % WEIGHT BASED INFUSION
3.0000 mL/kg/h | INTRAVENOUS | Status: AC
  Administered 2023-06-08: 3 mL/kg/h via INTRAVENOUS

## 2023-06-08 MED ORDER — IOHEXOL 300 MG/ML  SOLN
INTRAMUSCULAR | Status: DC | PRN
  Administered 2023-06-08: 203 mL

## 2023-06-08 MED ORDER — HEPARIN (PORCINE) IN NACL 1000-0.9 UT/500ML-% IV SOLN
INTRAVENOUS | Status: DC | PRN
  Administered 2023-06-08 (×2): 500 mL

## 2023-06-08 MED ORDER — LIDOCAINE HCL 1 % IJ SOLN
INTRAMUSCULAR | Status: AC
  Filled 2023-06-08: qty 20

## 2023-06-08 SURGICAL SUPPLY — 11 items
CATH 5FR JL3.5 JR4 ANG PIG MP (CATHETERS) IMPLANT
CATH INFINITI 5 FR MPA2 (CATHETERS) IMPLANT
DEVICE RAD TR BAND REGULAR (VASCULAR PRODUCTS) IMPLANT
DRAPE BRACHIAL (DRAPES) IMPLANT
GLIDESHEATH SLEND SS 6F .021 (SHEATH) IMPLANT
GUIDEWIRE INQWIRE 1.5J.035X260 (WIRE) IMPLANT
INQWIRE 1.5J .035X260CM (WIRE) ×1
PACK CARDIAC CATH (CUSTOM PROCEDURE TRAY) ×1 IMPLANT
PROTECTION STATION PRESSURIZED (MISCELLANEOUS) ×1
SET ATX-X65L (MISCELLANEOUS) IMPLANT
STATION PROTECTION PRESSURIZED (MISCELLANEOUS) IMPLANT

## 2023-06-08 NOTE — CV Procedure (Signed)
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

## 2023-06-10 LAB — CARDIAC CATHETERIZATION: Cath EF Quantitative: 55 %

## 2023-06-11 ENCOUNTER — Encounter: Payer: Self-pay | Admitting: Internal Medicine

## 2023-07-23 ENCOUNTER — Encounter (HOSPITAL_COMMUNITY): Payer: Self-pay

## 2023-07-23 ENCOUNTER — Telehealth (HOSPITAL_COMMUNITY): Payer: Self-pay

## 2023-07-23 NOTE — Telephone Encounter (Signed)
Attempted to call patient in regards to Cardiac Rehab - LM on VM Mailed letter 

## 2023-07-23 NOTE — Telephone Encounter (Signed)
Outside/paper referral received by Gwyndolyn Saxon, NP from [facility]. Will fax over Physician order and request further documents. Insurance benefits and eligibility to be determined.

## 2023-08-24 ENCOUNTER — Telehealth (HOSPITAL_COMMUNITY): Payer: Self-pay

## 2023-08-24 NOTE — Telephone Encounter (Signed)
No response from in regards to Cardiac Rehab  Closed referral

## 2024-01-07 ENCOUNTER — Other Ambulatory Visit: Payer: Self-pay | Admitting: Internal Medicine

## 2024-01-07 DIAGNOSIS — R634 Abnormal weight loss: Secondary | ICD-10-CM

## 2024-01-07 DIAGNOSIS — R194 Change in bowel habit: Secondary | ICD-10-CM

## 2024-01-07 DIAGNOSIS — Z0000 Encounter for general adult medical examination without abnormal findings: Secondary | ICD-10-CM

## 2024-01-11 ENCOUNTER — Other Ambulatory Visit: Payer: Self-pay | Admitting: Internal Medicine

## 2024-01-11 DIAGNOSIS — J90 Pleural effusion, not elsewhere classified: Secondary | ICD-10-CM

## 2024-01-12 ENCOUNTER — Ambulatory Visit
Admission: RE | Admit: 2024-01-12 | Discharge: 2024-01-12 | Disposition: A | Discharge status: Home / Self Care | Source: Ambulatory Visit | Attending: Internal Medicine | Admitting: Internal Medicine

## 2024-01-12 DIAGNOSIS — J90 Pleural effusion, not elsewhere classified: Secondary | ICD-10-CM

## 2024-01-12 DIAGNOSIS — R634 Abnormal weight loss: Secondary | ICD-10-CM

## 2024-01-12 DIAGNOSIS — R194 Change in bowel habit: Secondary | ICD-10-CM | POA: Diagnosis present

## 2024-01-12 DIAGNOSIS — Z0000 Encounter for general adult medical examination without abnormal findings: Secondary | ICD-10-CM

## 2024-02-11 ENCOUNTER — Other Ambulatory Visit: Payer: Self-pay | Admitting: Pulmonary Disease

## 2024-02-11 DIAGNOSIS — J90 Pleural effusion, not elsewhere classified: Secondary | ICD-10-CM

## 2024-02-14 ENCOUNTER — Ambulatory Visit
Admission: RE | Admit: 2024-02-14 | Discharge: 2024-02-14 | Disposition: A | Discharge status: Home / Self Care | Source: Ambulatory Visit | Attending: Radiology | Admitting: Radiology

## 2024-02-14 ENCOUNTER — Ambulatory Visit
Admission: RE | Admit: 2024-02-14 | Discharge: 2024-02-14 | Disposition: A | Discharge status: Home / Self Care | Source: Ambulatory Visit | Attending: Pulmonary Disease | Admitting: Pulmonary Disease

## 2024-02-14 ENCOUNTER — Other Ambulatory Visit: Payer: Self-pay | Admitting: Radiology

## 2024-02-14 DIAGNOSIS — J90 Pleural effusion, not elsewhere classified: Secondary | ICD-10-CM

## 2024-02-14 DIAGNOSIS — Z9889 Other specified postprocedural states: Secondary | ICD-10-CM

## 2024-02-14 LAB — LACTATE DEHYDROGENASE, PLEURAL OR PERITONEAL FLUID: LD, Fluid: 110 U/L — ABNORMAL HIGH (ref 3–23)

## 2024-02-14 LAB — BODY FLUID CELL COUNT WITH DIFFERENTIAL
Eos, Fluid: 0 %
Lymphs, Fluid: 75 %
Monocyte-Macrophage-Serous Fluid: 21 %
Neutrophil Count, Fluid: 4 %
Total Nucleated Cell Count, Fluid: 690 uL

## 2024-02-14 LAB — GLUCOSE, PLEURAL OR PERITONEAL FLUID: Glucose, Fluid: 118 mg/dL

## 2024-02-14 LAB — AMYLASE, PLEURAL OR PERITONEAL FLUID: Amylase, Fluid: 39 U/L

## 2024-02-14 LAB — PROTEIN, PLEURAL OR PERITONEAL FLUID: Total protein, fluid: 4.7 g/dL

## 2024-02-14 MED ORDER — LIDOCAINE HCL (PF) 1 % IJ SOLN
8.0000 mL | Freq: Once | INTRAMUSCULAR | Status: AC
Start: 2024-02-14 — End: 2024-02-14
  Administered 2024-02-14: 8 mL via INTRADERMAL
  Filled 2024-02-14: qty 8

## 2024-02-14 NOTE — Discharge Instructions (Signed)
 Reviewed with patient

## 2024-02-14 NOTE — Procedures (Signed)
 PROCEDURE SUMMARY:  Successful US  guided left thoracentesis. Yielded 1.2L of amber fluid. Patient tolerated procedure well. No immediate complications. EBL = trace  Specimen sent for labs.  Post procedure chest X-ray reveals no pneumothorax  Bianco Cange Alonzo Artis PA-C 02/14/2024 12:17 PM

## 2024-02-16 LAB — ACID FAST SMEAR (AFB, MYCOBACTERIA): Acid Fast Smear: NEGATIVE

## 2024-02-16 LAB — CYTOLOGY - NON PAP

## 2024-02-17 LAB — BODY FLUID CULTURE W GRAM STAIN: Culture: NO GROWTH

## 2024-02-17 LAB — CHOLESTEROL, BODY FLUID: Cholesterol, Fluid: 90 mg/dL

## 2024-02-17 LAB — FUNGUS CULTURE WITH STAIN

## 2024-02-17 LAB — FUNGUS CULTURE RESULT

## 2024-02-23 LAB — MISC LABCORP TEST (SEND OUT): Labcorp test code: 9985

## 2024-03-09 ENCOUNTER — Other Ambulatory Visit: Payer: Self-pay | Admitting: Internal Medicine

## 2024-03-09 DIAGNOSIS — R634 Abnormal weight loss: Secondary | ICD-10-CM

## 2024-03-09 DIAGNOSIS — R911 Solitary pulmonary nodule: Secondary | ICD-10-CM

## 2024-03-14 ENCOUNTER — Ambulatory Visit
Admission: RE | Admit: 2024-03-14 | Discharge: 2024-03-14 | Disposition: A | Discharge status: Home / Self Care | Source: Ambulatory Visit | Attending: Internal Medicine | Admitting: Internal Medicine

## 2024-03-14 DIAGNOSIS — R634 Abnormal weight loss: Secondary | ICD-10-CM | POA: Insufficient documentation

## 2024-03-14 DIAGNOSIS — R911 Solitary pulmonary nodule: Secondary | ICD-10-CM | POA: Diagnosis present

## 2024-03-15 LAB — FUNGUS CULTURE WITH STAIN

## 2024-03-15 LAB — FUNGAL ORGANISM REFLEX

## 2024-03-29 LAB — ACID FAST CULTURE WITH REFLEXED SENSITIVITIES (MYCOBACTERIA): Acid Fast Culture: NEGATIVE

## 2024-04-06 ENCOUNTER — Other Ambulatory Visit: Payer: Self-pay | Admitting: Internal Medicine

## 2024-04-06 DIAGNOSIS — N281 Cyst of kidney, acquired: Secondary | ICD-10-CM

## 2024-04-10 NOTE — Progress Notes (Signed)
 DIVISION OF PULMONARY AND CRITICAL CARE MEDICINE                              FOLLOW UP ENCOUNTER     Chief complaint: Left pleural effusion with complete atelectasis  History of Present Illness Russell Wu is a 78 year old male who presents with pleural effusion following coronary artery bypass grafting (CABG).  He underwent a CT scan which revealed a moderate amount of fluid accumulation around the left lung. A previous drainage procedure reduced the fluid, but it remains present. The fluid was dark in color, likened to 'Pepsi Cola', and the drainage procedure was comfortable for him. He has improved breathing and increased ability to use a spirometer at home, although he is not yet back to his previous capacity for deep breaths.  No swelling of the ankles and no chest pain, though he experiences occasional tightness in the pectoral muscles that resolves quickly. He is not currently using any medications for breathing or allergies.  A comprehensive analysis of the pleural fluid showed normal sugar levels, no bacterial growth, and no cancer cells. The fluid was lymphocyte predominant. There were no abnormal amylase or lipase levels. The fluid was also negative for fungal cultures and Gram stain.  His past medical history includes a coronary artery bypass grafting (CABG) performed in late October, during which the mammary artery was harvested. He experienced a decline in progress and increased fatigue around January, which may correlate with the onset of fluid accumulation.   Past Medical History:   Past Medical History:  Diagnosis Date  . Allergic state c. 1960  . Asthma (HHS-HCC) 04/10/2010  . Brain lesion 06/21/2020  . Chronic anemia 06/26/2019  . Coronary artery disease involving native coronary artery of native heart with angina pectoris () 06/15/2023  . Coronary artery disease involving native coronary artery of native heart with angina  pectoris () 06/15/2023  . DDD (degenerative disc disease), lumbar   . Essential hypertension 01/31/2014  . Essential hypertension, benign 04/10/2010  . History of rectal polyps   . Hoarse voice quality   . Hyperglycemia   . Hyperlipidemia   . Hyperplastic colon polyp 07/02/2016  . Hypertension   . Meningioma (CMS/HHS-HCC)   . OA (osteoarthritis) 01/31/2014  . OSA (obstructive sleep apnea) 11/19/2021  . Osteoarthritis   . Prediabetes 01/31/2014  . Pure hypercholesterolemia 04/10/2010  . Vocal cord paralysis    right    Past Surgical History:   Past Surgical History:  Procedure Laterality Date  . COLONOSCOPY  07/02/2016   Hyperplastic colon polyp/PHx colon polyps/Repeat 74yrs/MUS  . COLONOSCOPY  03/01/2019   normal colon/No further repeat due to age/TKT  . EGD  03/01/2019   Normal EGD/ No repeat needed at this time/TKT  . INCISION TENDON SHEATH FOR TRIGGER FINGER Right 05/27/2020   Middle finger (Dr. Kathlynn)  . DECOMPRESSION  VERTEBRAL ARTERY TRANSCONDYLAR APPROACH W/RESECTION Right 06/21/2020   Procedure: far lateral transcondylar craniotomy for resection of right sided foramen magnum lesion;  Surgeon: Bernetta Hildegard Blossom, MD;  Location: [facility] OR;  Service: Neurosurgery;  Laterality: Right;  . MICROSURGERY N/A 06/21/2020   Procedure: MICROSURGICAL TECHNIQUES, REQUIRING USE OF OPERATING MICROSCOPE (LIST IN ADDITION TO PRIMARY PROCEDURE);  Surgeon: Zomorodi, Ali Reza, MD;  Location: [facility] OR;  Service: Neurosurgery;  Laterality: N/A;  . RESECTION/EXCISION POSTERIOR CRANIAL FOSSA LESION Right 06/21/2020   Procedure: RESECTION OR EXCISION NEOPLASTIC, VASCULAR OR INFECT LESION BASE POSTERIOR CRANIAL FOSSA, JUGULAR FORAMEN, FORAMEN MAGNUM, OR C1-C3 VERTEBRAL BODIES; INTRADURAL, INCL DURAL REPAIR, WITH/WITHOUT GRAFT;  Surgeon: Zomorodi, Ali Reza, MD;  Location: [facility];  Service: Neurosurgery;  Laterality: Right;  . DECOMPRESSION  VERTEBRAL ARTERY TRANSCONDYLAR APPROACH W/RESECTION Right  06/21/2020   Procedure: far lateral transcondylar craniotomy for resection of right sided foramen magnum lesion;  Surgeon: Bluford Elspeth Shove, MD;  Location: [facility] OR;  Service: Neurosurgery;  Laterality: Right;  . LARYNGOPLASTY Bilateral 11/08/2020   Procedure: LARYNGOPLASTY, MEDIALIZATION, UNILATERAL;  Surgeon: Gust Thedora Blumenthal, MD;  Location: [facility] OR;  Service: Otolaryngology Head and Neck;  Laterality: Bilateral;  . CORONARY ARTERY BYPASS W/SINGLE ARTERY GRAFT N/A 07/12/2023   Procedure: CORONARY ARTERY BYPASS, USING ARTERIAL GRAFT(S); SINGLE ARTERIAL GRAFT, internal mammary artery harvest;  Surgeon: Trudy Juliene Ade, MD;  Location: [facility];  Service: Cardiothoracic;  Laterality: N/A;  . CORONARY ARTERY BYPASS W/VEIN ONLY N/A 07/12/2023   Procedure: CORONARY ARTERY BYPASS, USING VENOUS GRAFT(S) AND ARTERIAL GRAFT(S);SINGLE VEIN GRAFT (LIST IN ADDITION TO PRIMARY PROCEDURE);  Surgeon: Trudy Juliene Ade, MD;  Location: [facility];  Service: Cardiothoracic;  Laterality: N/A;  . ENDOSCOPIC HARVEST OF VEINS FOR CORONARY ARTERY BYPASS PROCEDURE N/A 07/12/2023   Procedure: ENDOSCOPY, SURGICAL, INCLUDING VIDEO-ASSISTED HARVEST OF VEIN(S) FOR CORONARY ARTERY BYPASS PROCEDURE  (LIST IN ADDITION TO PRIMARY PROCEDURE);  Surgeon: Trudy Juliene Ade, MD;  Location: [facility];  Service: Cardiothoracic;  Laterality: N/A;  . EXPLORATION CHEST FOR POSTOPERATIVE THROMBOSIS/INFECTION STERNOTOMY N/A 07/12/2023   Procedure: ADULT, EXPLORATION CHEST FOR POSTOPERATIVE HEMORRHAGE, THROMBOSIS OR INFECTION STERNOTOMY;  Surgeon: Trudy Juliene Ade, MD;  Location: [facility];  Service: Cardiothoracic;  Laterality: N/A;  . CATARACT EXTRACTION Bilateral 2022  . CONVERSION PREVIOUS HIP SURGERY TO TOTAL HIP ARTHROPLASTY Left   . ENDOSCOPIC CARPAL TUNNEL RELEASE Bilateral   . JOINT REPLACEMENT     left hip - 09/2012  . KNEE ARTHROSCOPY     Both knees - in the  2000's  . TONSILLECTOMY    . Trigger finger release      Allergies:   Allergies  Allergen Reactions  . Lincocin [Lincomycin] Rash  . Penicillin V Unknown    Childhood allergy  . Statins-Hmg-Coa Reductase Inhibitors Muscle Pain  . Sulfa (Sulfonamide Antibiotics) Unknown    Childhood allergy  . Lincomycin Hcl Rash    Current Medications:   Prior to Admission medications  Medication Sig Taking? Last Dose  acetaminophen  (TYLENOL ) 500 MG tablet Take by mouth Yes Taking  aspirin  81 MG EC tablet Take 1 tablet (81 mg total) by mouth once daily Yes Taking  cyanocobalamin (VITAMIN B12) 1000 MCG tablet Take 1,000 mcg by mouth once daily Yes Taking  doxazosin (CARDURA) 2 MG tablet TAKE 1 TABLET BY MOUTH AT NIGHT Yes Taking  ezetimibe (ZETIA) 10 mg tablet TAKE 1 TABLET BY MOUTH ONCE  DAILY Yes Taking  gabapentin (NEURONTIN) 300 MG capsule 1 capsule twice a day for 2 weeks, then increase to 1 capsule 3 times a day Patient taking differently: Take 300 mg by mouth 3 (three) times daily 1 capsule twice a day for 2 weeks, then increase to 1 capsule 3 times a day Yes Taking  iron bis-gly/FA/C/B12/Ca/succ (IRON-150 ORAL) Take 1 tablet by mouth once daily    Yes Taking  magnesium glycinate (MAG GLYCINATE)  100 mg Tab Take by mouth once daily Yes Taking  metoprolol SUCCinate (TOPROL-XL) 25 MG XL tablet Take 1 tablet (25 mg total) by mouth once daily Yes Taking  mirtazapine (REMERON) 15 MG tablet Take 1 tablet (15 mg total) by mouth at bedtime Yes Taking  mv-min/folic/vit K/lycop/coQ10 (DAILY MULTIVITAMIN ORAL) Take by mouth Yes Taking  potassium chloride (KLOR-CON M20) 20 MEQ ER tablet TAKE 1 AND 1/2 TABLETS BY MOUTH  DAILY Yes Taking    Family History:   Family History  Problem Relation Name Age of Onset  . Stroke Mother Delano Frate   . Osteoporosis (Thinning of bones) Mother Lukis Bunt   . Skin cancer Mother Robey Massmann   . Lung cancer Father    . Myocardial Infarction  (Heart attack) Maternal Grandfather    . Anesthesia problems Neg Hx      Social History:   Social History   Socioeconomic History  . Marital status: Married  . Number of children: 1  Occupational History  . Occupation: retired Psychologist, clinical  Tobacco Use  . Smoking status: Never    Passive exposure: Past  . Smokeless tobacco: Never  . Tobacco comments:    Tobacco killed my father (lung cancer) and my grandfather (fell out of a tobacco barn).  Vaping Use  . Vaping status: Never Used  Substance and Sexual Activity  . Alcohol use: Yes    Alcohol/week: 2.0 standard drinks of alcohol    Types: 2 Standard drinks or equivalent per week    Comment: Moderate (2-3 drinks/per week)  . Drug use: No  . Sexual activity: Not Currently    Partners: Female    Birth control/protection: Surgical, Abstinence   Social Drivers of Health   Financial Resource Strain: Low Risk  (04/10/2024)   Overall Financial Resource Strain (CARDIA)   . Difficulty of Paying Living Expenses: Not very hard  Food Insecurity: No Food Insecurity (04/10/2024)   Hunger Vital Sign   . Worried About Programme researcher, broadcasting/film/video in the Last Year: Never true   . Ran Out of Food in the Last Year: Never true  Transportation Needs: No Transportation Needs (04/10/2024)   PRAPARE - Transportation   . Lack of Transportation (Medical): No   . Lack of Transportation (Non-Medical): No  Housing Stability: Low Risk  (04/10/2024)   Housing Stability Vital Sign   . Unable to Pay for Housing in the Last Year: No   . Number of Times Moved in the Last Year: 0   . Homeless in the Last Year: No    Review of Systems:   A 10 point review of systems is negative, except for the pertinent positives and negatives detailed in the HPI.  Vitals:   Vitals:   04/10/24 1523  BP: (!) 156/85  BP Location: Right upper arm  Patient Position: Sitting  BP Cuff Size: Adult  Pulse: 68  SpO2: 96%  Weight: 78.7 kg (173 lb  6.4 oz)  Height: 171.5 cm (5' 7.5)     Body mass index is 26.76 kg/m.  Physical Exam:   Physical Exam Vitals and nursing note reviewed.  Constitutional:      General: She is not in acute distress.    Appearance: Normal appearance. She is not ill-appearing, toxic-appearing or diaphoretic.  HENT:     Head: Normocephalic and atraumatic.     Right Ear: External ear normal.     Left Ear: External ear normal.  Eyes:  General:        Right eye: No discharge.        Left eye: No discharge.     Extraocular Movements: Extraocular movements intact.     Pupils: Pupils are equal, round, and reactive to light.  Cardiovascular:     Rate and Rhythm: Normal rate and regular rhythm.     Pulses: Normal pulses.     Heart sounds: Normal heart sounds. No murmur heard.    No friction rub. No gallop.  Abdominal:     General: Bowel sounds are normal.  Skin:    General: Skin is warm and dry.     Capillary Refill: Capillary refill takes less than 2 seconds.  Neurological:     Mental Status: She is alert.     Lab and Imaging Results:   Results LABS Pleural fluid analysis: Normal glucose, lymphocyte predominant, negative for bacteria, negative Gram stain, negative fungal culture, no malignant cells, normal amylase and lipase  RADIOLOGY Chest CT: Moderate pleural effusion on the left side    Assessment and Plan:   Diagnoses and all orders for this visit:  Recurrent left pleural effusion -     US  thoracentesis with imaging guidance; Future  Dyspnea and respiratory abnormalities  Compressive atelectasis  DOE (dyspnea on exertion)    Assessment & Plan Left pleural effusion Moderate left pleural effusion persists despite previous drainage. Fluid analysis shows no infection, cancer, or pancreatic involvement. Fluid is lymphocyte predominant, suggesting a chronic process. No bacterial, fungal, or cancer cells detected. The etiology remains unclear, possibly related to previous CABG  surgery. He reports improved breathing and no pain associated with the effusion. - Repeat drainage of pleural fluid. - Consider referral to thoracic surgeon for pleurodesis if effusion recurs persistently.     I spent a total of 31 minutes in both face-to-face and non-face-to-face activities, excluding procedures performed, for this visit on the date of this encounter.   This note has been created using dictation software tool and any typographical errors are purely unintentional.  Patient received an After Visit Summary

## 2024-04-12 ENCOUNTER — Other Ambulatory Visit: Payer: Self-pay | Admitting: Pulmonary Disease

## 2024-04-12 ENCOUNTER — Ambulatory Visit
Admission: RE | Admit: 2024-04-12 | Discharge: 2024-04-12 | Disposition: A | Discharge status: Home / Self Care | Source: Ambulatory Visit | Attending: Internal Medicine | Admitting: Internal Medicine

## 2024-04-12 DIAGNOSIS — N281 Cyst of kidney, acquired: Secondary | ICD-10-CM | POA: Diagnosis present

## 2024-04-12 DIAGNOSIS — J90 Pleural effusion, not elsewhere classified: Secondary | ICD-10-CM

## 2024-04-13 ENCOUNTER — Other Ambulatory Visit: Payer: Self-pay | Admitting: Urology

## 2024-04-13 ENCOUNTER — Ambulatory Visit
Admission: RE | Admit: 2024-04-13 | Discharge: 2024-04-13 | Disposition: A | Discharge status: Home / Self Care | Source: Ambulatory Visit | Attending: Urology

## 2024-04-13 ENCOUNTER — Ambulatory Visit
Admission: RE | Admit: 2024-04-13 | Discharge: 2024-04-13 | Disposition: A | Discharge status: Home / Self Care | Source: Ambulatory Visit | Attending: Pulmonary Disease | Admitting: Pulmonary Disease

## 2024-04-13 DIAGNOSIS — Z9889 Other specified postprocedural states: Secondary | ICD-10-CM | POA: Diagnosis not present

## 2024-04-13 DIAGNOSIS — Z951 Presence of aortocoronary bypass graft: Secondary | ICD-10-CM | POA: Diagnosis not present

## 2024-04-13 DIAGNOSIS — J90 Pleural effusion, not elsewhere classified: Secondary | ICD-10-CM | POA: Diagnosis present

## 2024-04-13 LAB — BODY FLUID CELL COUNT WITH DIFFERENTIAL
Eos, Fluid: 0 %
Lymphs, Fluid: 88 %
Monocyte-Macrophage-Serous Fluid: 10 %
Neutrophil Count, Fluid: 0 %
Other Cells, Fluid: 2 %
Total Nucleated Cell Count, Fluid: 1183 uL

## 2024-04-13 LAB — GLUCOSE, PLEURAL OR PERITONEAL FLUID: Glucose, Fluid: 119 mg/dL

## 2024-04-13 LAB — LACTATE DEHYDROGENASE, PLEURAL OR PERITONEAL FLUID: LD, Fluid: 112 U/L — ABNORMAL HIGH (ref 3–23)

## 2024-04-13 MED ORDER — LIDOCAINE HCL (PF) 1 % IJ SOLN
10.0000 mL | Freq: Once | INTRAMUSCULAR | Status: AC
  Administered 2024-04-13: 10 mL
  Filled 2024-04-13: qty 10

## 2024-04-13 NOTE — Procedures (Signed)
 PROCEDURE SUMMARY:  Successful image-guided left-sided diagnostic and therapeutic thoracentesis. Yielded 1.65 liters of clear, straw-colored pleural fluid. Patient tolerated procedure well. EBL: Zero No immediate complications.  Specimen was sent for labs. Post procedure CXR shows no pneumothorax.  Please see imaging section of Epic for full dictation.  Danitra Payano A Mohini Heathcock PA-C 04/13/2024 11:10 AM

## 2024-04-17 LAB — CYTOLOGY - NON PAP
